# Patient Record
Sex: Female | Born: 1960 | Race: White | Hispanic: No | Marital: Married | Smoking: Never smoker
Health system: Southern US, Community
[De-identification: ages and names within clinical notes are randomized; demographics above are authoritative.]

## PROBLEM LIST (undated history)

## (undated) DIAGNOSIS — F329 Major depressive disorder, single episode, unspecified: Secondary | ICD-10-CM

## (undated) DIAGNOSIS — F419 Anxiety disorder, unspecified: Secondary | ICD-10-CM

## (undated) DIAGNOSIS — Z9289 Personal history of other medical treatment: Secondary | ICD-10-CM

## (undated) DIAGNOSIS — G5603 Carpal tunnel syndrome, bilateral upper limbs: Secondary | ICD-10-CM

## (undated) DIAGNOSIS — F32A Depression, unspecified: Secondary | ICD-10-CM

## (undated) DIAGNOSIS — E039 Hypothyroidism, unspecified: Secondary | ICD-10-CM

## (undated) DIAGNOSIS — B009 Herpesviral infection, unspecified: Secondary | ICD-10-CM

## (undated) DIAGNOSIS — S83209A Unspecified tear of unspecified meniscus, current injury, unspecified knee, initial encounter: Secondary | ICD-10-CM

## (undated) DIAGNOSIS — K611 Rectal abscess: Secondary | ICD-10-CM

## (undated) DIAGNOSIS — M069 Rheumatoid arthritis, unspecified: Secondary | ICD-10-CM

## (undated) HISTORY — DX: Depression, unspecified: F32.A

## (undated) HISTORY — PX: TONSILLECTOMY AND ADENOIDECTOMY: SUR1326

## (undated) HISTORY — DX: Carpal tunnel syndrome, bilateral upper limbs: G56.03

## (undated) HISTORY — DX: Rectal abscess: K61.1

## (undated) HISTORY — DX: Major depressive disorder, single episode, unspecified: F32.9

## (undated) HISTORY — DX: Personal history of other medical treatment: Z92.89

## (undated) HISTORY — DX: Herpesviral infection, unspecified: B00.9

## (undated) HISTORY — DX: Rheumatoid arthritis, unspecified: M06.9

## (undated) HISTORY — DX: Anxiety disorder, unspecified: F41.9

## (undated) HISTORY — DX: Hypothyroidism, unspecified: E03.9

## (undated) HISTORY — DX: Unspecified tear of unspecified meniscus, current injury, unspecified knee, initial encounter: S83.209A

---

## 1993-11-18 HISTORY — PX: TUBAL LIGATION: SHX77

## 2001-08-24 ENCOUNTER — Other Ambulatory Visit: Admission: RE | Admit: 2001-08-24 | Discharge: 2001-08-24 | Payer: Self-pay | Admitting: Obstetrics and Gynecology

## 2002-10-22 ENCOUNTER — Other Ambulatory Visit: Admission: RE | Admit: 2002-10-22 | Discharge: 2002-10-22 | Payer: Self-pay | Admitting: Obstetrics and Gynecology

## 2003-12-29 ENCOUNTER — Other Ambulatory Visit: Admission: RE | Admit: 2003-12-29 | Discharge: 2003-12-29 | Payer: Self-pay | Admitting: Obstetrics and Gynecology

## 2004-01-04 ENCOUNTER — Ambulatory Visit (HOSPITAL_COMMUNITY): Admission: RE | Admit: 2004-01-04 | Discharge: 2004-01-04 | Payer: Self-pay | Admitting: Obstetrics and Gynecology

## 2005-03-11 ENCOUNTER — Other Ambulatory Visit: Admission: RE | Admit: 2005-03-11 | Discharge: 2005-03-11 | Payer: Self-pay | Admitting: Obstetrics and Gynecology

## 2005-05-28 ENCOUNTER — Ambulatory Visit (HOSPITAL_COMMUNITY): Admission: RE | Admit: 2005-05-28 | Discharge: 2005-05-28 | Payer: Self-pay | Admitting: Obstetrics and Gynecology

## 2005-06-07 ENCOUNTER — Encounter: Admission: RE | Admit: 2005-06-07 | Discharge: 2005-06-07 | Payer: Self-pay | Admitting: Obstetrics and Gynecology

## 2006-04-24 ENCOUNTER — Other Ambulatory Visit: Admission: RE | Admit: 2006-04-24 | Discharge: 2006-04-24 | Payer: Self-pay | Admitting: Obstetrics & Gynecology

## 2006-07-19 HISTORY — PX: TOTAL ABDOMINAL HYSTERECTOMY: SHX209

## 2006-08-12 ENCOUNTER — Inpatient Hospital Stay (HOSPITAL_COMMUNITY): Admission: RE | Admit: 2006-08-12 | Discharge: 2006-08-14 | Payer: Self-pay | Admitting: Obstetrics & Gynecology

## 2006-08-12 ENCOUNTER — Encounter (INDEPENDENT_AMBULATORY_CARE_PROVIDER_SITE_OTHER): Payer: Self-pay | Admitting: *Deleted

## 2006-09-16 ENCOUNTER — Ambulatory Visit (HOSPITAL_COMMUNITY): Admission: RE | Admit: 2006-09-16 | Discharge: 2006-09-16 | Payer: Self-pay | Admitting: Obstetrics & Gynecology

## 2007-10-12 ENCOUNTER — Ambulatory Visit (HOSPITAL_COMMUNITY): Admission: RE | Admit: 2007-10-12 | Discharge: 2007-10-12 | Payer: Self-pay | Admitting: Obstetrics & Gynecology

## 2008-10-20 ENCOUNTER — Ambulatory Visit (HOSPITAL_COMMUNITY): Admission: RE | Admit: 2008-10-20 | Discharge: 2008-10-20 | Payer: Self-pay | Admitting: Obstetrics & Gynecology

## 2009-02-16 DIAGNOSIS — K611 Rectal abscess: Secondary | ICD-10-CM

## 2009-02-16 HISTORY — PX: INCISION AND DRAINAGE PERIRECTAL ABSCESS: SHX1804

## 2009-02-16 HISTORY — DX: Rectal abscess: K61.1

## 2009-10-26 ENCOUNTER — Ambulatory Visit (HOSPITAL_COMMUNITY): Admission: RE | Admit: 2009-10-26 | Discharge: 2009-10-26 | Payer: Self-pay | Admitting: Obstetrics & Gynecology

## 2009-12-15 HISTORY — PX: CARPAL TUNNEL RELEASE: SHX101

## 2010-11-13 ENCOUNTER — Ambulatory Visit (HOSPITAL_COMMUNITY)
Admission: RE | Admit: 2010-11-13 | Discharge: 2010-11-13 | Payer: Self-pay | Source: Home / Self Care | Attending: Obstetrics & Gynecology | Admitting: Obstetrics & Gynecology

## 2011-04-05 NOTE — Op Note (Signed)
NAMEJONNELL, Carla Lawrence                 ACCOUNT NO.:  [id number]   MEDICAL RECORD NO.:  [id number]          PATIENT TYPE:  AMB   LOCATION:  SDC                           FACILITY:  [facility]   PHYSICIAN:  M. Leda Quail, MD  DATE OF BIRTH:  1961-10-15   DATE OF PROCEDURE:  08/12/2006  DATE OF DISCHARGE:                                 OPERATIVE REPORT   PREOPERATIVE DIAGNOSES:  68. A 50 year old gravida 2, para 2 married white female with history of      menorrhagia.  2. Fibroid uterus.  3. Iron deficiency anemia.  4. History of cesarean section x2.  5. Hypothyroidism.   POSTOPERATIVE DIAGNOSES:  71. A 50 year old gravida 2, para 2 married white female with history of      menorrhagia.  2. Fibroid uterus.  3. Iron deficiency anemia.  4. History of cesarean section x2.  5. Hypothyroidism.   PROCEDURE:  Total abdominal hysterectomy.   SURGEON:  M. Leda Quail, M.D.   ASSISTANT:  Meredeth Ide, M.D.   ANESTHESIA:  General endotracheal.   SPECIMENS:  Uterus and cervix to pathology.   ESTIMATED BLOOD LOSS:  100 mL.   URINE OUTPUT:  250 mL of clear urine.   FLUIDS:  2200 mL of LR.   COMPLICATIONS:  None.   INDICATIONS:  Ms. Carla Lawrence is a very pleasant 50 year old G2, P2 married white  female with a history of C-section x2.  She presented here for annual exam  which was earlier in the year, complaining of much worse menstrual cycles.  She is having several days of very heavy bleeding where she is having to  change a super pad and tampon every 2-3 hours and still soaking through at  times. The patient is passing large clots. Her hemoglobin was in the upper  10 range and iron studies were consistent with iron deficiency anemia.  She  underwent an ultrasound which showed multiple fibroids in the uterus.  We  discussed treatment options and she opted for definitive treatment. She has  been on iron to improve her hemoglobin which was 11.8 before the surgery.   PROCEDURE:  The  patient was taken to the operating room. She was placed in  supine position with running IVs in place. Informed consent is present on  the chart.  General endotracheal anesthesia was administered by the  anesthesia staff.   The legs were frog-legged and the abdomen, perineum, inner thighs and vagina  area prepped and draped in a normal sterile fashion.  Prior to draping the  patient a Foley catheter is inserted in the bladder under sterile conditions  and the legs are straightened. Once the patient was draped in a normal  sterile fashion, a Pfannenstiel skin incision was made with the knife and  carried down through subcutaneous fat and tissues. Cautery was used for  hemostasis. The fascia was identified and niched in the midline. Fascial  incision was extended laterally using curved Mayo scissors. Kocher clamps  were applied to the fascial incision and underlying rectus muscles were  dissected off sharply. In similar fashion, Kocher clamps  were applied to the  aspect of the fascial incision and the rectus muscles were dissected off  sharply. Rectus muscles appeared to have previously been sewn together after  a C-section.  Therefore, high at the level of the reapproximation of the  rectus muscles, hemostats were used to spread the muscles. The peritoneum  was actually popped through here very easily. The operator's index finger  was placed in the incision and the rectus muscles were first separated  sharply with Metzenbaum scissors and then the underlying peritoneum was  incised with Metzenbaum scissors. This was done superiorly and inferiorly  and the anterior incision was extended until the edge of the bladder was  noted.  At this point, survey of the pelvis was performed. There did appear  to be some adhesions to the anterior aspect of the cervix from previous C-  sections. Otherwise there was no endometriosis present.  A Balfour retractor  with short blades were placed in the  incision.  Care was taken to ensure  there was no bowel behind the blade. Also, the blades were not sitting down  on the psoas muscle.  The bladder blade was inserted and attached. Then the  bowel was packed anteriorly with three moist laparotomy sponges and a  superior arm was placed on the Balfour retractor and a malleable retractor  was used to hold the bowel and skin edge out of the way.   At this point long Kelly clamps were placed across the utero-ovarian  pedicles. The ovaries were visualized. History of tubal ligation was  present.  Ovaries appeared normal. There was no scar tissue, no  endometriosis and no cyst present. The fallopian tubes appeared normal  except on the left side it was somewhat clubbed but again, there had been a  tubal ligation previously.  The round ligament on the right side was suture  ligated and incised, opening the anterior and posterior leaf of the broad  ligament. The anterior leaf of the broad ligament was then incised along the  peritoneal edge to the level of the internal os to begin the creation of the  bladder flap.  The posterior leaf was incised parallel to the IP ligament.  The utero-ovarian pedicle was isolated and ureter was identified before  isolating the pedicle.  Then a curved Heaney clamp was placed across the  pedicle. This pedicle was transected and suture ligated first with free tie  of 0 Vicryl and second was a stitch tie. In similar fashion the round  ligament on the left side was suture ligated and incised. The anterior leaf  of the broad ligament was opened along the peritoneal edge and connected to  a previous incision to again begin creation of bladder flap.  Posterior leaf  was incised along the peritoneum parallel to the IP ligament. Then the  uterine ovarian pedicle on the left side was isolated and clamped with a  curved Heaney clamp. The pedicle was transected and suture ligated with a free tie of 0 Vicryl and then a stitch  of 0 Vicryl.  At this point,  attention was turned anteriorly on the bladder flap. With a significant  amount of scar tissue that was present, using sharp dissection, this was  incised and the pubovesicocervical fascia was ultimately identified and then  with a sponge stick, the bladder was slid down the cervix and the white  glistening tissue of the cervix was easily identified. At this point the  uterine arteries were skeletonized bilaterally. Curved  Heaney clamps were  placed at the level of the internal os of the cervix and across the uterine  arteries bilaterally. These pedicles were transected and suture ligated with  two stitches of #0 Vicryl.  The cardial ligaments were serially clamped,  transected and suture ligated with #0 Vicryl. The bladder was well out of  the way.  Then at the base of the cervix curved Heaney clamps were placed  across the uterosacral ligament. Pedicles were transected and the vagina was  entered.  Heaney suture of #0 Vicryl was placed across these pedicles and  the sutures were left long. At this point, as the vagina was entered, a pair  of Ocie Doyne scissors was used to circumscribe around the cervix, trimming  as little vaginal mucosa as possible.  Once the cervix had been  circumscribed with the [facility] scissors, the specimen was handed off.  This was just the cervix and the uterus.  Then Kocher clamps were used to  grasp the vaginal mucosa and the peritoneum posteriorly and vaginal mucosa  anteriorly.  An angle stitch was placed and attached to the uterosacral  ligament to ensure excellent hemostasis.  This was done bilaterally.  Then  the cuff was closed with three interrupted figure-of-8 sutures of #0 Vicryl.  These sutures were left leg. At this point, the pelvis was irrigated. There  was a small amount of bleeding from the posterior peritoneum on the  posterior aspect of the vagina and a figure-of-8 suture of #2-0 Vicryl was  used to obtain  excellent hemostasis here.  The round ligaments were  inspected. No bleeding was noted. The bladder flap was inspected. No  bleeding was noted and the uterine ovarian pedicles were inspected. No  bleeding was noted. The ovaries were then attached to the round ligaments to  keep them out of the pelvis and from falling off the cuff.  At this point  there was excellent hemostasis and bilateral ureter peristalsis and  procedure was ended. The Balfour retractor was removed and the associated  attached blades. Three moistened laparotomy sponges were removed and the  bowel was placed back in normal anatomic position.  The peritoneum was then  closed with a running suture of #2-0 Vicryl.  Fascial tissue and rectus  muscles were visualized. No bleeding was noted.   An On-Q pump tubing was placed on the right side of the patient  subfascially.  The tubing was held in place along the skin with Steri- Strips.  Then the fascia was closed with running stitch. The stitches were  initially placed in the corner and run to the midline bilaterally.  The  subcutaneous tissue was irrigated.  There was a small amount of bleeding  over on the left side which was made hemostatic with cautery.  Then an On-Q  pump tubing on the left side was also placed subcutaneously.  The tubing was  attached to the skin using Steri-Strips.  Then the skin was closed with a  subcuticular stitch of 3-0 Vicryl.  The incision was cleansed and Benzoin  and Steri-Strips were applied.  Five mL of 1% lidocaine was instilled  subcutaneously and 10 mL of 1% lidocaine was instilled subfascially.  The  patient tolerated the procedure well. Sponge, lap, needle and instrument  counts were correct x2. She made urine during the procedure and she was  extubated from general endotracheal anesthesia without difficulty and taken  to the recovery room in stable condition.      Desiree Lucy  Hyacinth Meeker, MD  Electronically Signed     MSM/MEDQ  D:   08/12/2006  T:  08/14/2006  Job:  [id number]

## 2011-04-05 NOTE — Discharge Summary (Signed)
Carla Lawrence, Carla Lawrence                 ACCOUNT NO.:  [id number]   MEDICAL RECORD NO.:  [id number]          PATIENT TYPE:  INP   LOCATION:  9309                          FACILITY:  [facility]   PHYSICIAN:  M. Leda Quail, MD  DATE OF BIRTH:  [birth year]   DATE OF ADMISSION:  08/12/2006  DATE OF DISCHARGE:  08/14/2006                                 DISCHARGE SUMMARY   ADMISSION DIAGNOSES:  87. A 50 year old, G2, P8, married white female with history of      menorrhagia.  2. Fibroid uterus.  3. Iron-deficiency anemia.  4. History of cesarean section x2.  5. Hypothyroidism.   DISCHARGE DIAGNOSIS:  1. A 50 year old, G2, P77, married white female with history of      menorrhagia.  2. Fibroid uterus.  3. Iron-deficiency anemia.  4. History of cesarean section x2.  5. Hypothyroidism.   PROCEDURE:  Total abdominal hysterectomy.   HISTORY AND PHYSICAL:  Written H&P is in the chart.  Carla Lawrence was admitted  for same-day surgery and taken to the operating room where a TAH was  performed due to the above problems.  The surgery was uneventful.  She had  approximately 100 cc of blood loss.  She did well during the surgery and  made 250 cc of clear urine output.  The patient was taken to the recovery  room after the surgery was over and after the appropriate time there to the  third floor for recovery.  She had a Foley catheter overnight and Dilaudid  PCA, as well as Toradol for pain control.  On the evening of postoperative  day #0, she is doing well, having good pain control.  She had no nausea.  Her evening was uneventful, and the morning of postoperative day #2, vital  signs were stable and she was afebrile.  Her Foley catheter was removed, and  she was able to void without difficulty.  The patient was able to advance to  a regular diet, and the Toradol and Dilaudid PCA were removed.  Postoperative hemoglobin was 9.8 and postoperative white count was 10.6,  platelet count was 304.  By the  evening of postoperative day #0, she was  completely unhooked from all IV tubing.  She did well overnight.  On the  morning of postoperative day #2 had placed flatus, she was ambulating and  taking a shower, had excellent pain control with Percocet.  She was  otherwise feeling well, and discharge was felt appropriate.   PATHOLOGY:  Pathology showed adenomyosis and uterine leiomyomas.      Carla Keas, MD  Electronically Signed    MSM/MEDQ  D:  08/29/2006  T:  09/01/2006  Job:  [id number]

## 2011-10-24 ENCOUNTER — Other Ambulatory Visit: Payer: Self-pay | Admitting: Obstetrics & Gynecology

## 2011-10-24 DIAGNOSIS — Z1231 Encounter for screening mammogram for malignant neoplasm of breast: Secondary | ICD-10-CM

## 2011-12-03 ENCOUNTER — Ambulatory Visit (HOSPITAL_COMMUNITY): Payer: Self-pay

## 2011-12-05 ENCOUNTER — Ambulatory Visit (HOSPITAL_COMMUNITY)
Admission: RE | Admit: 2011-12-05 | Discharge: 2011-12-05 | Disposition: A | Discharge status: Home / Self Care | Payer: PRIVATE HEALTH INSURANCE | Source: Ambulatory Visit | Attending: Obstetrics & Gynecology | Admitting: Obstetrics & Gynecology

## 2011-12-05 DIAGNOSIS — Z1231 Encounter for screening mammogram for malignant neoplasm of breast: Secondary | ICD-10-CM | POA: Insufficient documentation

## 2012-11-13 ENCOUNTER — Other Ambulatory Visit: Payer: Self-pay | Admitting: Obstetrics & Gynecology

## 2012-11-13 DIAGNOSIS — Z1231 Encounter for screening mammogram for malignant neoplasm of breast: Secondary | ICD-10-CM

## 2012-12-08 ENCOUNTER — Ambulatory Visit (HOSPITAL_COMMUNITY): Payer: PRIVATE HEALTH INSURANCE

## 2012-12-22 ENCOUNTER — Ambulatory Visit (HOSPITAL_COMMUNITY)
Admission: RE | Admit: 2012-12-22 | Discharge: 2012-12-22 | Disposition: A | Discharge status: Home / Self Care | Payer: PRIVATE HEALTH INSURANCE | Source: Ambulatory Visit | Attending: Obstetrics & Gynecology | Admitting: Obstetrics & Gynecology

## 2012-12-22 DIAGNOSIS — Z1231 Encounter for screening mammogram for malignant neoplasm of breast: Secondary | ICD-10-CM | POA: Insufficient documentation

## 2013-06-08 ENCOUNTER — Other Ambulatory Visit: Payer: Self-pay | Admitting: Obstetrics & Gynecology

## 2013-06-08 NOTE — Telephone Encounter (Signed)
Pharmacy Requesting   Zoloft 50 mg Synthroid 88 mcg  Last Aex 05/14/13 rfs x 1 year given for both AEX scheduled 06/16/13  Synthroid 88 mcg #30/0rfs ; Zoloft 50 mg #30/0rf's sent through to [facility] pharmacy to last pt. Until AEX.

## 2013-06-08 NOTE — Telephone Encounter (Signed)
Correction AEX was 05/14/12

## 2013-06-15 ENCOUNTER — Encounter: Payer: Self-pay | Admitting: Obstetrics & Gynecology

## 2013-06-16 ENCOUNTER — Ambulatory Visit: Payer: Self-pay | Admitting: Obstetrics & Gynecology

## 2013-06-16 ENCOUNTER — Encounter: Payer: Self-pay | Admitting: Obstetrics & Gynecology

## 2013-06-16 ENCOUNTER — Ambulatory Visit (INDEPENDENT_AMBULATORY_CARE_PROVIDER_SITE_OTHER): Payer: PRIVATE HEALTH INSURANCE | Admitting: Obstetrics & Gynecology

## 2013-06-16 VITALS — BP 148/84 | HR 64 | Resp 12 | Ht 61.0 in | Wt 235.4 lb

## 2013-06-16 DIAGNOSIS — N952 Postmenopausal atrophic vaginitis: Secondary | ICD-10-CM

## 2013-06-16 DIAGNOSIS — Z01419 Encounter for gynecological examination (general) (routine) without abnormal findings: Secondary | ICD-10-CM

## 2013-06-16 DIAGNOSIS — Z0000 Encounter for general adult medical examination without abnormal findings: Secondary | ICD-10-CM

## 2013-06-16 DIAGNOSIS — E039 Hypothyroidism, unspecified: Secondary | ICD-10-CM

## 2013-06-16 LAB — POCT URINALYSIS DIPSTICK
Ketones, UA: NEGATIVE
Protein, UA: NEGATIVE
pH, UA: 5

## 2013-06-16 LAB — LIPID PANEL
HDL: 55 mg/dL (ref >39)
LDL Cholesterol: 189 mg/dL — ABNORMAL HIGH (ref 0–99)
Total CHOL/HDL Ratio: 5.1 Ratio

## 2013-06-16 LAB — TSH: TSH: 10.054 u[IU]/mL — ABNORMAL HIGH (ref 0.350–4.500)

## 2013-06-16 MED ORDER — SERTRALINE HCL 50 MG PO TABS: ORAL_TABLET | ORAL | Status: DC

## 2013-06-16 NOTE — Progress Notes (Addendum)
52 y.o. G2P2 MarriedCaucasianF here for annual exam.  Heading to conference in [place], [place].  Repots having hot flashes especially at night.  Coping ok with this.    Patient's last menstrual period was 11/18/2005.          Sexually active: yes  The current method of family planning is status post hysterectomy.    Exercising: yes  occassional walking with dog Smoker:  no  Health Maintenance: Pap:  04/24/06 WNL, H/O TAH History of abnormal Pap:  no MMG:  12/22/12 normal  Colonoscopy:  2012 repeat in 10 years, Dr. Loreta Ave BMD:   none TDaP:  7/10 Screening Labs: today, Hb today: 13.4, Urine today: WBC-trace   reports that she has never smoked. She has never used smokeless tobacco. She reports that she does not drink alcohol or use illicit drugs.  Past Medical History  Diagnosis Date  . HSV infection     HSV I/II  . Hypothyroid   . Anxiety and depression   . Perirectal abscess     h/o  . History of blood transfusion     during T&A    Past Surgical History  Procedure Laterality Date  . Tubal ligation  1995  . Cesarean section    . Total abdominal hysterectomy  9/07  . Incision and drainage perirectal abscess  4/10  . Carpal tunnel release  12/15/09    and tendon clean out  . Incision and drainage perirectal abscess  5/12    and EUA  . Tonsillectomy and adenoidectomy      Current Outpatient Prescriptions  Medication Sig Dispense Refill  . amoxicillin (AMOXIL) 875 MG tablet 875 mg 2 (two) times daily.      Marland Kitchen FOLIC ACID PO Take by mouth daily.      Marland Kitchen levothyroxine (SYNTHROID, LEVOTHROID) 88 MCG tablet TAKE 1 TABLET BY MOUTH EVERY DAY  30 tablet  0  . methotrexate (50 MG/ML) 1 G injection Inject 70 mg into the vein once a week.      . Multiple Vitamins-Minerals (MULTIVITAMIN PO) Take by mouth daily.      . sertraline (ZOLOFT) 50 MG tablet TAKE 1 TABLET BY MOUTH EVERY DAY  30 tablet  0  . Vitamin D, Ergocalciferol, (DRISDOL) 50000 UNITS CAPS Take 50,000 Units by mouth. Every other  week       No current facility-administered medications for this visit.    Family History  Problem Relation Age of Onset  . Diabetes Maternal Grandfather   . Cancer Father     colon and bone  . Rheumatic fever Mother   . Bell's palsy Mother   . Heart disease Mother   . Heart attack Brother     over 36 years old    ROS:  Pertinent items are noted in HPI.  Otherwise, a comprehensive ROS was negative.  Exam:   BP 148/84  Pulse 64  Resp 12  Ht 5\' 1"  (1.549 m)  Wt 235 lb 6.4 oz (106.777 kg)  BMI 44.5 kg/m2  LMP 11/18/2005  Weight change: +4lb  Height: 5\' 1"  (154.9 cm)  Ht Readings from Last 3 Encounters:  06/16/13 5\' 1"  (1.549 m)    General appearance: alert, cooperative and appears stated age Head: Normocephalic, without obvious abnormality, atraumatic Neck: no adenopathy, supple, symmetrical, trachea midline and thyroid normal to inspection and palpation Lungs: clear to auscultation bilaterally Breasts: normal appearance, no masses or tenderness Heart: regular rate and rhythm Abdomen: soft, non-tender; bowel sounds normal; no masses,  no organomegaly Extremities: extremities normal, atraumatic, no cyanosis or edema Skin: Skin color, texture, turgor normal. No rashes or lesions Lymph nodes: Cervical, supraclavicular, and axillary nodes normal. No abnormal inguinal nodes palpated Neurologic: Grossly normal   Pelvic: External genitalia:  no lesions              Urethra:  normal appearing urethra with no masses, tenderness or lesions              Bartholins and Skenes: normal                 Vagina: normal appearing vagina with normal color and discharge, no lesions              Cervix: absent              Pap taken: no Bimanual Exam:  Uterus:  uterus absent              Adnexa: no mass, fullness, tenderness               Rectovaginal: Confirms               Anus:  normal sphincter tone, no lesions  A:  Well Woman with normal exam H/O TAH secondary to  fibroids/adenomyosis H/O Depression H/O hypothyroidism R.A.--sees Dr. Nickola Major H/O perirectal abscess 4/10  P:   Mammogram yearly.  Pt knows is due.  She will schedule.   Pap smear not needed.  H/O TAH. TSH, Lipids, and Vit D today. FSH. Synthroid qd.  RX to pharmacy.  Will want 90 day supply. Zoloft 50mg .  RX to pharmacy.   Vit D rx will be done after labs back. return annually or prn  An After Visit Summary was printed and given to the patient.

## 2013-06-16 NOTE — Patient Instructions (Addendum)

## 2013-06-17 MED ORDER — LEVOTHYROXINE SODIUM 100 MCG PO TABS: ORAL_TABLET | ORAL | Status: DC

## 2013-06-17 MED ORDER — VITAMIN D (ERGOCALCIFEROL) 1.25 MG (50000 UNIT) PO CAPS: 50000.0000 [IU] | ORAL_CAPSULE | ORAL | Status: DC

## 2013-06-17 NOTE — Addendum Note (Signed)
Addended by: Jerene Bears on: 06/17/2013 05:55 AM   Modules accepted: Orders

## 2013-06-18 ENCOUNTER — Ambulatory Visit: Payer: Self-pay | Admitting: Obstetrics & Gynecology

## 2013-06-22 ENCOUNTER — Telehealth: Payer: Self-pay

## 2013-06-22 NOTE — Telephone Encounter (Signed)
8/4 [email or web address]//kn

## 2013-06-22 NOTE — Telephone Encounter (Signed)
Message copied by Elisha Headland on Tue Jun 22, 2013 11:05 AM ------      Message from: Jerene Bears      Created: Thu Jun 17, 2013  5:52 AM       1) cholesterol is high.  Needs to see PCP this year to talk about treatment.  2)Definitely menopausal.  She is having hot flashes but doesn't want to be on anything right now  3) Thyroid function is low.  Increase Synthroid to daily.  RX to pharmacy done.  Recheck TSH here 3 months.  Order placed.  4)Vit 26.  Is she taking her Vitamin D regularly.  If easier, can take 1000 IU OTC Vit D and daily daily.  Recheck next year. ------

## 2013-07-04 ENCOUNTER — Other Ambulatory Visit: Payer: Self-pay | Admitting: Obstetrics & Gynecology

## 2013-07-11 ENCOUNTER — Other Ambulatory Visit: Payer: Self-pay | Admitting: Obstetrics & Gynecology

## 2013-07-21 NOTE — Telephone Encounter (Signed)
Patient returning [facility] call. Please call back at work number above.

## 2013-07-22 ENCOUNTER — Telehealth: Payer: Self-pay | Admitting: Obstetrics & Gynecology

## 2013-07-22 NOTE — Telephone Encounter (Signed)
Spoke with pt about lab results. Pt advised to see PCP about cholesterol management. Advised Vit D is 26. Pt reports taking supplement regularly. Advised pt she can take 1000 IU daily instead of Rx. Advised that her thyroid level is low and an increased dose of her synthroid was sent to pharmacy. Pt aware and has already been taking it. Scheduled lab appt to recheck TSH 09-22-13. Pt requests early appt at 8:30.

## 2013-07-22 NOTE — Telephone Encounter (Signed)
Amy notified patient of results.

## 2013-07-22 NOTE — Telephone Encounter (Signed)
Patient says she is returning a call to Kelly. °

## 2013-09-22 ENCOUNTER — Ambulatory Visit (INDEPENDENT_AMBULATORY_CARE_PROVIDER_SITE_OTHER): Payer: 59 | Admitting: Obstetrics & Gynecology

## 2013-09-22 DIAGNOSIS — E039 Hypothyroidism, unspecified: Secondary | ICD-10-CM

## 2013-09-27 ENCOUNTER — Telehealth: Payer: Self-pay

## 2013-09-27 MED ORDER — LEVOTHYROXINE SODIUM 100 MCG PO TABS: ORAL_TABLET | ORAL | Status: DC

## 2013-09-27 NOTE — Telephone Encounter (Signed)
Message copied by Elisha Headland on Mon Sep 27, 2013 11:52 AM ------      Message from: Jerene Bears      Created: Mon Sep 27, 2013  8:45 AM       Inform TSH normal.  Continue Synthroid and will recheck level next year.  Order placed for RFs for year. ------

## 2013-09-27 NOTE — Telephone Encounter (Signed)
lmtcb

## 2013-10-20 NOTE — Telephone Encounter (Signed)
Patient notified

## 2014-01-24 ENCOUNTER — Other Ambulatory Visit: Payer: Self-pay | Admitting: Obstetrics & Gynecology

## 2014-01-24 DIAGNOSIS — Z1231 Encounter for screening mammogram for malignant neoplasm of breast: Secondary | ICD-10-CM

## 2014-02-01 ENCOUNTER — Other Ambulatory Visit: Payer: Self-pay | Admitting: Obstetrics & Gynecology

## 2014-02-01 ENCOUNTER — Ambulatory Visit (HOSPITAL_COMMUNITY)
Admission: RE | Admit: 2014-02-01 | Discharge: 2014-02-01 | Disposition: A | Discharge status: Home / Self Care | Payer: PRIVATE HEALTH INSURANCE | Source: Ambulatory Visit | Attending: Obstetrics & Gynecology | Admitting: Obstetrics & Gynecology

## 2014-02-01 DIAGNOSIS — Z1231 Encounter for screening mammogram for malignant neoplasm of breast: Secondary | ICD-10-CM | POA: Insufficient documentation

## 2014-06-29 ENCOUNTER — Other Ambulatory Visit: Payer: Self-pay | Admitting: Obstetrics & Gynecology

## 2014-06-29 NOTE — Telephone Encounter (Signed)
Last refilled/AEX: 06/16/13 #90/4 refills  AEX Scheduled: 08/26/14 with Dr. Hyacinth Meeker  Please Advise.

## 2014-08-26 ENCOUNTER — Encounter: Payer: Self-pay | Admitting: Obstetrics & Gynecology

## 2014-08-26 ENCOUNTER — Ambulatory Visit (INDEPENDENT_AMBULATORY_CARE_PROVIDER_SITE_OTHER): Payer: 59 | Admitting: Obstetrics & Gynecology

## 2014-08-26 VITALS — BP 138/86 | HR 68 | Resp 18 | Ht 61.0 in | Wt 240.0 lb

## 2014-08-26 DIAGNOSIS — Z0000 Encounter for general adult medical examination without abnormal findings: Secondary | ICD-10-CM

## 2014-08-26 DIAGNOSIS — Z01419 Encounter for gynecological examination (general) (routine) without abnormal findings: Secondary | ICD-10-CM

## 2014-08-26 DIAGNOSIS — M069 Rheumatoid arthritis, unspecified: Secondary | ICD-10-CM

## 2014-08-26 LAB — COMPREHENSIVE METABOLIC PANEL
ALBUMIN: 4.1 g/dL (ref 3.5–5.2)
ALT: 13 U/L (ref 0–35)
AST: 15 U/L (ref 0–37)
Alkaline Phosphatase: 59 U/L (ref 39–117)
BUN: 14 mg/dL (ref 6–23)
CALCIUM: 9.1 mg/dL (ref 8.4–10.5)
CHLORIDE: 105 meq/L (ref 96–112)
CO2: 26 mEq/L (ref 19–32)
Creat: 0.79 mg/dL (ref 0.50–1.10)
GLUCOSE: 87 mg/dL (ref 70–99)
POTASSIUM: 4.4 meq/L (ref 3.5–5.3)
SODIUM: 139 meq/L (ref 135–145)
TOTAL PROTEIN: 6.7 g/dL (ref 6.0–8.3)
Total Bilirubin: 0.5 mg/dL (ref 0.2–1.2)

## 2014-08-26 LAB — TSH: TSH: 0.456 u[IU]/mL (ref 0.350–4.500)

## 2014-08-26 LAB — LIPID PANEL
CHOLESTEROL: 232 mg/dL — AB (ref 0–200)
HDL: 60 mg/dL (ref >39)
LDL Cholesterol: 146 mg/dL — ABNORMAL HIGH (ref 0–99)
Total CHOL/HDL Ratio: 3.9 Ratio
Triglycerides: 131 mg/dL (ref <150)
VLDL: 26 mg/dL (ref 0–40)

## 2014-08-26 LAB — POCT URINALYSIS DIPSTICK
BILIRUBIN UA: NEGATIVE
Glucose, UA: NEGATIVE
Ketones, UA: NEGATIVE
NITRITE UA: NEGATIVE
Protein, UA: NEGATIVE
UROBILINOGEN UA: NEGATIVE
pH, UA: 7

## 2014-08-26 LAB — HEMOGLOBIN, FINGERSTICK: HEMOGLOBIN, FINGERSTICK: 13.8 g/dL (ref 12.0–16.0)

## 2014-08-26 MED ORDER — SERTRALINE HCL 50 MG PO TABS: ORAL_TABLET | ORAL | Status: DC

## 2014-08-26 MED ORDER — LEVOTHYROXINE SODIUM 100 MCG PO TABS: ORAL_TABLET | ORAL | Status: DC

## 2014-08-26 NOTE — Progress Notes (Signed)
53 y.o. G2P2 MarriedCaucasianF here for annual exam.  Has a new granddaughter--Hunter Delorise Shiner.  They live in [place].  Pt is doing really well.  No vaginal bleeding.    Pt reports stopping her Methotrexate.  Having a lot of carpel tunnel issues on her right.  Has appt next week.  May have to have surgery.    Stopped her Vit D as well.  Does need labs today.    Complaint of hot flashes.  Not interested in HRT.  Pt wants to discuss OTC products.  Patient's last menstrual period was 11/18/2005.          Sexually active: Yes.    The current method of family planning is status post hysterectomy.    Exercising: No.  The patient does not participate in regular exercise at present. Smoker:  no  Health Maintenance: Pap: 04/2006 History of abnormal Pap:  no MMG: 01/2014 BIRADS1: Neg Colonoscopy: 07/2011 - repeat in 10 years, Dr. Loreta Ave BMD:   None TDaP: 05/2009 Screening Labs: today, Hb today: 13.8, Urine today: WBC:Mod, EEF:EOFHQ.   reports that she has never smoked. She has never used smokeless tobacco. She reports that she does not drink alcohol or use illicit drugs.  Past Medical History  Diagnosis Date  . HSV infection     HSV I/II  . Hypothyroid   . Anxiety and depression   . Perirectal abscess 4/10    h/o  . History of blood transfusion     during T&A  . Rheumatoid arthritis     Dr. Nickola Major  . Carpal tunnel syndrome, bilateral     Past Surgical History  Procedure Laterality Date  . Tubal ligation  1995  . Cesarean section  1989, 1995  . Total abdominal hysterectomy  9/07  . Incision and drainage perirectal abscess  4/10  . Carpal tunnel release  12/15/09    and tendon clean out  . Tonsillectomy and adenoidectomy      Current Outpatient Prescriptions  Medication Sig Dispense Refill  . levothyroxine (SYNTHROID, LEVOTHROID) 100 MCG tablet TAKE 1 TABLET BY MOUTH EVERY DAY  90 tablet  3  . Multiple Vitamins-Minerals (MULTIVITAMIN PO) Take by mouth daily.      . sertraline  (ZOLOFT) 50 MG tablet TAKE 1 TABLET BY MOUTH EVERY DAY  90 tablet  0  . CIPRODEX otic suspension        No current facility-administered medications for this visit.    Family History  Problem Relation Age of Onset  . Diabetes Maternal Grandfather   . Cancer Father     colon and bone  . Rheumatic fever Mother   . Bell's palsy Mother   . Heart disease Mother   . Heart attack Brother     over 65 years old    ROS:  Pertinent items are noted in HPI.  Otherwise, a comprehensive ROS was negative.  Exam:   BP 138/86  Pulse 68  Resp 18  Ht 5\' 1"  (1.549 m)  Wt 240 lb (108.863 kg)  BMI 45.37 kg/m2  LMP 11/18/2005  Weight change: +5#  Height: 5\' 1"  (154.9 cm)  Ht Readings from Last 3 Encounters:  08/26/14 5\' 1"  (1.549 m)  06/16/13 5\' 1"  (1.549 m)    General appearance: alert, cooperative and appears stated age Head: Normocephalic, without obvious abnormality, atraumatic Neck: no adenopathy, supple, symmetrical, trachea midline and thyroid normal to inspection and palpation Lungs: clear to auscultation bilaterally Breasts: normal appearance, no masses or tenderness Heart: regular  rate and rhythm Abdomen: soft, non-tender; bowel sounds normal; no masses,  no organomegaly Extremities: extremities normal, atraumatic, no cyanosis or edema Skin: Skin color, texture, turgor normal. No rashes or lesions Lymph nodes: Cervical, supraclavicular, and axillary nodes normal. No abnormal inguinal nodes palpated Neurologic: Grossly normal   Pelvic: External genitalia:  no lesions              Urethra:  normal appearing urethra with no masses, tenderness or lesions              Bartholins and Skenes: normal                 Vagina: normal appearing vagina with normal color and discharge, no lesions              Cervix: absent              Pap taken: No. Bimanual Exam:  Uterus:  uterus absent              Adnexa: no mass, fullness, tenderness               Rectovaginal: Confirms                Anus:  normal sphincter tone, no lesions  A:  Well Woman with normal exam  H/O TAH secondary to fibroids/adenomyosis  H/O Depression  H/O hypothyroidism  R.A.--sees Dr. Nickola Major  H/O perirectal abscess 4/10   P: Mammogram yearly.  Pap smear not needed. H/O TAH.  TSH, Lipids, CMP and Vit D today.  Synthroid qd.  RX to pharmacy. 90 day supply given  Zoloft 50mg . RX to pharmacy.  90 day supply given May need to restart Vit D.  Pt aware we will call with results OTC products for hot flashes discussed and information given. return annually or prn  An After Visit Summary was printed and given to the patient.

## 2014-08-26 NOTE — Patient Instructions (Signed)
Estroven/Estroven PM Black Cohosh--up to twice daily Melatonin--sleep

## 2014-08-27 LAB — VITAMIN D 25 HYDROXY (VIT D DEFICIENCY, FRACTURES): VIT D 25 HYDROXY: 21 ng/mL — AB (ref 30–89)

## 2014-08-29 ENCOUNTER — Telehealth: Payer: Self-pay

## 2014-08-29 NOTE — Telephone Encounter (Signed)
Lmtcb//kn 

## 2014-08-29 NOTE — Telephone Encounter (Signed)
Message copied by Elisha Headland on Mon Aug 29, 2014 10:03 AM ------      Message from: Jerene Bears      Created: Mon Aug 29, 2014  6:14 AM       Inform cholesterol looks much better than last year.  Total 232.  Was 282.  TGs normla.  LDLs are 146, were 189.  Should always do this fasting.              CMP, TSH were normal.            Vit D 21.  I really do think she should take some.  OTC Vit D 1000 IU daily would be fine.             ------

## 2014-08-30 NOTE — Telephone Encounter (Signed)
Pt informed of results and voiced understanding

## 2014-09-19 ENCOUNTER — Encounter: Payer: Self-pay | Admitting: Obstetrics & Gynecology

## 2014-09-27 ENCOUNTER — Other Ambulatory Visit: Payer: Self-pay | Admitting: Obstetrics & Gynecology

## 2014-09-27 NOTE — Telephone Encounter (Signed)
Last AEX and refill  08/26/14 #90/4R

## 2014-10-03 ENCOUNTER — Encounter: Payer: Self-pay | Admitting: Obstetrics & Gynecology

## 2015-03-02 ENCOUNTER — Emergency Department (HOSPITAL_COMMUNITY)
Admission: EM | Admit: 2015-03-02 | Discharge: 2015-03-02 | Disposition: A | Discharge status: Home / Self Care | Payer: Commercial Indemnity | Attending: Emergency Medicine | Admitting: Emergency Medicine

## 2015-03-02 ENCOUNTER — Encounter (HOSPITAL_COMMUNITY): Payer: Self-pay | Admitting: Emergency Medicine

## 2015-03-02 DIAGNOSIS — Z8719 Personal history of other diseases of the digestive system: Secondary | ICD-10-CM | POA: Insufficient documentation

## 2015-03-02 DIAGNOSIS — Z8619 Personal history of other infectious and parasitic diseases: Secondary | ICD-10-CM | POA: Diagnosis not present

## 2015-03-02 DIAGNOSIS — Z791 Long term (current) use of non-steroidal anti-inflammatories (NSAID): Secondary | ICD-10-CM | POA: Insufficient documentation

## 2015-03-02 DIAGNOSIS — E039 Hypothyroidism, unspecified: Secondary | ICD-10-CM | POA: Diagnosis not present

## 2015-03-02 DIAGNOSIS — Z79899 Other long term (current) drug therapy: Secondary | ICD-10-CM | POA: Insufficient documentation

## 2015-03-02 DIAGNOSIS — R040 Epistaxis: Secondary | ICD-10-CM | POA: Diagnosis not present

## 2015-03-02 DIAGNOSIS — F418 Other specified anxiety disorders: Secondary | ICD-10-CM | POA: Diagnosis not present

## 2015-03-02 DIAGNOSIS — Z8669 Personal history of other diseases of the nervous system and sense organs: Secondary | ICD-10-CM | POA: Diagnosis not present

## 2015-03-02 DIAGNOSIS — M069 Rheumatoid arthritis, unspecified: Secondary | ICD-10-CM | POA: Diagnosis not present

## 2015-03-02 NOTE — ED Provider Notes (Addendum)
CSN: [id number]     Arrival date & time 03/02/15  5397 History   First MD Initiated Contact with Patient 03/02/15 [phone]     Chief Complaint  Patient presents with  . Epistaxis     (Consider location/radiation/quality/duration/timing/severity/associated sxs/prior Treatment) Patient is a 54 y.o. female presenting with nosebleeds. The history is provided by the patient.  Epistaxis Associated symptoms: no congestion, no dizziness and no headaches    patient was at work at approximately 9 in the morning had onset of a nosebleed from the left nares. Patient had trouble with that occurring on Saturday but it was stopped spontaneously with the pinching her nose. Bleeding now has also stopped the. 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 Blood pressure was high at work 186/108. Blood pressure upon arrival here was 160/85. Patient recently Symbicort nose spray. For allergies. Patient also had a nosebleed about a month ago.  Patient denies headache shortness of breath chest pain or any stroke symptoms.  Past Medical History  Diagnosis Date  . HSV infection     HSV I/II  . Hypothyroid   . Anxiety and depression   . Perirectal abscess 4/10    h/o  . History of blood transfusion     during T&A  . Rheumatoid arthritis     Dr. Nickola Major  . Carpal tunnel syndrome, bilateral    Past Surgical History  Procedure Laterality Date  . Tubal ligation  1995  . Cesarean section  1989, 1995  . Total abdominal hysterectomy  9/07  . Incision and drainage perirectal abscess  4/10  . Carpal tunnel release  12/15/09    and tendon clean out  . Tonsillectomy and adenoidectomy     Family History  Problem Relation Age of Onset  . Diabetes Maternal Grandfather   . Cancer Father     colon and bone  . Rheumatic fever Mother   . Bell's palsy Mother   . Heart disease Mother   . Heart attack Brother     over 40 years old   History  Substance Use Topics  . Smoking status: Never Smoker   . Smokeless tobacco: Never  Used  . Alcohol Use: No   OB History    Gravida Para Term Preterm AB TAB SAB Ectopic Multiple Living   2 2        2      Review of Systems  Constitutional: Negative for fatigue.  HENT: Positive for nosebleeds. Negative for congestion.   Eyes: Negative for visual disturbance.  Respiratory: Negative for shortness of breath.   Cardiovascular: Negative for chest pain.  Gastrointestinal: Negative for nausea, vomiting, abdominal pain, diarrhea and blood in stool.  Genitourinary: Negative for dysuria.  Musculoskeletal: Negative for back pain.  Skin: Negative for rash.  Neurological: Negative for dizziness, syncope, light-headedness and headaches.  Hematological: Does not bruise/bleed easily.  Psychiatric/Behavioral: Negative for confusion.      Allergies  Sulfa antibiotics  Home Medications   Prior to Admission medications   Medication Sig Start Date End Date Taking? Authorizing Provider  levothyroxine (SYNTHROID, LEVOTHROID) 100 MCG tablet TAKE 1 TABLET BY MOUTH EVERY DAY 08/26/14  Yes 10/26/14, MD  montelukast (SINGULAIR) 10 MG tablet Take 10 mg by mouth daily.   Yes Historical Provider, MD  naproxen sodium (ANAPROX) 220 MG tablet Take 220 mg by mouth every morning.   Yes Historical Provider, MD  sertraline (ZOLOFT) 50 MG tablet TAKE 1 TABLET BY MOUTH EVERY DAY 08/26/14  Yes 10/26/14  Hyacinth Meeker, MD   BP 133/66 mmHg  Pulse 85  Temp(Src) 97.6 F (36.4 C) (Oral)  Resp 16  SpO2 98%  LMP 11/18/2005 Physical Exam  Constitutional: She is oriented to person, place, and time. She appears well-developed and well-nourished. No distress.  HENT:  Head: Normocephalic and atraumatic.  Mouth/Throat: Oropharynx is clear and moist.  No bleeding down the back of throat. Left and right nares with no active bleeding or bleeding source.  Eyes: Conjunctivae and EOM are normal. Pupils are equal, round, and reactive to light.  Neck: Normal range of motion.  Cardiovascular: Normal rate, regular  rhythm and normal heart sounds.   Pulmonary/Chest: Effort normal and breath sounds normal. No respiratory distress.  Abdominal: Soft. Bowel sounds are normal. There is no tenderness.  Musculoskeletal: Normal range of motion.  Neurological: She is alert and oriented to person, place, and time. No cranial nerve deficit. She exhibits normal muscle tone.  Skin: Skin is warm. No rash noted.  Nursing note and vitals reviewed.   ED Course  Procedures (including critical care time) Labs Review Labs Reviewed - No data to display  Imaging Review No results found.   EKG Interpretation None      MDM   Final diagnoses:  Epistaxis    Patient started on Symbicort nose spray for allergies 3 months ago. Patient with some difficulty of left-sided nosebleed that has been able to been stopped with pressure pinch method. No active bleeding currently. Patient does have ear nose and throat doctor to follow-up with. Patient given on nosebleed technique instructions. Patient will return if unable to control the bleeding. No reason for a packing at the current time. Patient's blood pressure has been now borderline and was elevated at work blood pressure now is down to 133/66. Patient will keep a blood pressure log and follow-up with her doctor. Patient without any bleeding problems anywhere else. Patient not on any blood thinners. Patient sometimes does take Anaprox. Patient informed that this could increase bleeding. Patient also without any stroke symptoms no significant chest pain no headache no shortness of breath.   Vanetta Mulders, MD 03/02/15 1024  Vanetta Mulders, MD 03/02/15 1024  Addendum: Correction misunderstanding with the patient patient is not on Symbicort does not use any Nose spray. So steroids no spray would not initially be a culprit in the current bleeding problem. Follow up with ear nose and throat still needed.  Vanetta Mulders, MD 03/02/15 1034

## 2015-03-02 NOTE — Discharge Instructions (Signed)
Nosebleed A nosebleed can be caused by many things, including:  Getting hit hard in the nose.  Infections.  Dry nose.  Colds.  Medicines. Your doctor may do lab testing if you get nosebleeds a lot and the cause is not known. HOME CARE   If your nose was packed with material, keep it there until your doctor takes it out. Put the pack back in your nose if the pack falls out.  Do not blow your nose for 12 hours after the nosebleed.  Sit up and bend forward if your nose starts bleeding again. Pinch the front half of your nose nonstop for 20 minutes.  Put petroleum jelly inside your nose every morning if you have a dry nose.  Use a humidifier to make the air less dry.  Do not take aspirin.  Try not to strain, lift, or bend at the waist for many days after the nosebleed. GET HELP RIGHT AWAY IF:   Nosebleeds keep happening and are hard to stop or control.  You have bleeding or bruises that are not normal on other parts of the body.  You have a fever.  The nosebleeds get worse.  You get lightheaded, feel faint, sweaty, or throw up (vomit) blood. MAKE SURE YOU:   Understand these instructions.  Will watch your condition.  Will get help right away if you are not doing well or get worse. Document Released: 08/13/2008 Document Revised: 01/27/2012 Document Reviewed: 08/13/2008 [facility] Patient Information 2015 [place], [place]. This information is not intended to replace advice given to you by your health care provider. Make sure you discuss any questions you have with your health care provider.  Would recommend following over your nose and throat doctor for the left-sided nosebleed. It may be related to the singular. Would recommend stopping singular in the meantime. Also recommend a daily log of your blood pressure to take to your primary care doctor. Blood pressure seems to be borderline in treatment for hypertension may be required.  Follow the nosebleed instructions  provided. Return if unable to stop the bleeding with the pinch method.

## 2015-03-02 NOTE — ED Notes (Signed)
Pt c/o of nose bleed that started around 9am today. Pt check blood pressure while at work BP 186/108. Pt current BP is 160/85. Bleeding is currently controled with direct pressure. Pt sts she had nose bleed Saturday for about 20 minutes w/ pressure. Pt denies taking BP medications at this time.

## 2015-03-29 ENCOUNTER — Other Ambulatory Visit: Payer: Self-pay | Admitting: Obstetrics & Gynecology

## 2015-03-29 DIAGNOSIS — Z1231 Encounter for screening mammogram for malignant neoplasm of breast: Secondary | ICD-10-CM

## 2015-04-06 ENCOUNTER — Ambulatory Visit (HOSPITAL_COMMUNITY)
Admission: RE | Admit: 2015-04-06 | Discharge: 2015-04-06 | Disposition: A | Discharge status: Home / Self Care | Payer: PRIVATE HEALTH INSURANCE | Source: Ambulatory Visit | Attending: Obstetrics & Gynecology | Admitting: Obstetrics & Gynecology

## 2015-04-06 ENCOUNTER — Other Ambulatory Visit: Payer: Self-pay | Admitting: Obstetrics & Gynecology

## 2015-04-06 DIAGNOSIS — Z1231 Encounter for screening mammogram for malignant neoplasm of breast: Secondary | ICD-10-CM | POA: Insufficient documentation

## 2015-08-27 ENCOUNTER — Other Ambulatory Visit: Payer: Self-pay | Admitting: Obstetrics & Gynecology

## 2015-08-28 NOTE — Telephone Encounter (Signed)
Medication refill request: Levothyroxine and Zoloft Last AEX:  08-26-14 Next AEX: 10-19-15 Last MMG (if hormonal medication request): 04-07-15 WNL Refill authorized: please advise

## 2015-10-06 ENCOUNTER — Ambulatory Visit: Payer: 59 | Admitting: Obstetrics & Gynecology

## 2015-10-19 ENCOUNTER — Encounter: Payer: Self-pay | Admitting: Obstetrics & Gynecology

## 2015-10-19 ENCOUNTER — Ambulatory Visit (INDEPENDENT_AMBULATORY_CARE_PROVIDER_SITE_OTHER): Payer: Managed Care, Other (non HMO) | Admitting: Obstetrics & Gynecology

## 2015-10-19 VITALS — BP 122/80 | HR 72 | Resp 16 | Ht 61.0 in | Wt 240.0 lb

## 2015-10-19 DIAGNOSIS — Z01419 Encounter for gynecological examination (general) (routine) without abnormal findings: Secondary | ICD-10-CM | POA: Diagnosis not present

## 2015-10-19 DIAGNOSIS — Z0000 Encounter for general adult medical examination without abnormal findings: Secondary | ICD-10-CM

## 2015-10-19 DIAGNOSIS — Z205 Contact with and (suspected) exposure to viral hepatitis: Secondary | ICD-10-CM | POA: Diagnosis not present

## 2015-10-19 LAB — POCT URINALYSIS DIPSTICK
Bilirubin, UA: NEGATIVE
Blood, UA: NEGATIVE
Glucose, UA: NEGATIVE
Ketones, UA: NEGATIVE
LEUKOCYTES UA: NEGATIVE
Nitrite, UA: NEGATIVE
PROTEIN UA: NEGATIVE
UROBILINOGEN UA: NEGATIVE
pH, UA: 5

## 2015-10-19 MED ORDER — LEVOTHYROXINE SODIUM 100 MCG PO TABS: 100.0000 ug | ORAL_TABLET | Freq: Every day | ORAL | Status: DC

## 2015-10-19 MED ORDER — SERTRALINE HCL 50 MG PO TABS
50.0000 mg | ORAL_TABLET | Freq: Every day | ORAL | Status: DC
Start: 2015-10-19 — End: 2016-11-02

## 2015-10-19 NOTE — Progress Notes (Signed)
54 y.o. G2P2 MarriedCaucasianF here for annual exam.  Doing well.  No vaginal bleeding.  Had a granddaughter in [place].  Going to the beach after [place] with her extended family.  D/w pt having Hep C testing.  She had several transfusions when tonsils were removed as a child.    PCP:  Dr. Tresa Endo in [facility].    Patient's last menstrual period was 11/18/2005.          Sexually active: Yes.    The current method of family planning is status post hysterectomy.    Exercising: No.  exercise Smoker:  no  Health Maintenance: Pap:  6/07 neg History of abnormal Pap:  no MMG: 04-06-15 category b density,birads 1:neg Colonoscopy:  2012 f/u 31yrs, Dr Loreta Ave BMD:   none TDaP:  2010 Screening Labs: today, Hb today: 13.4 Urine today: neg Self breast exam: done occ   reports that she has never smoked. She has never used smokeless tobacco. She reports that she does not drink alcohol or use illicit drugs.  Past Medical History  Diagnosis Date  . HSV infection     HSV I/II  . Hypothyroid   . Anxiety and depression   . Perirectal abscess 4/10    h/o  . History of blood transfusion     during T&A  . Rheumatoid arthritis (HCC)     Dr. Nickola Major  . Carpal tunnel syndrome, bilateral     Past Surgical History  Procedure Laterality Date  . Tubal ligation  1995  . Cesarean section  1989, 1995  . Total abdominal hysterectomy  9/07  . Incision and drainage perirectal abscess  4/10  . Carpal tunnel release  12/15/09    and tendon clean out  . Tonsillectomy and adenoidectomy      Current Outpatient Prescriptions  Medication Sig Dispense Refill  . levothyroxine (SYNTHROID, LEVOTHROID) 100 MCG tablet TAKE 1 TABLET BY MOUTH EVERY DAY 90 tablet 0  . montelukast (SINGULAIR) 10 MG tablet Take 10 mg by mouth daily.    . naproxen sodium (ANAPROX) 220 MG tablet Take 220 mg by mouth every morning.    . sertraline (ZOLOFT) 50 MG tablet TAKE 1 TABLET BY MOUTH EVERY DAY 90 tablet 0   No current  facility-administered medications for this visit.    Family History  Problem Relation Age of Onset  . Diabetes Maternal Grandfather   . Cancer Father     colon and bone  . Rheumatic fever Mother   . Bell's palsy Mother   . Heart disease Mother   . Heart attack Brother     over 27 years old    ROS:  Pertinent items are noted in HPI.  Otherwise, a comprehensive ROS was negative.  Exam:   BP 122/80 mmHg  Pulse 72  Resp 16  Ht 5\' 1"  (1.549 m)  Wt 240 lb (108.863 kg)  BMI 45.37 kg/m2  LMP 11/18/2005   Height: 5\' 1"  (154.9 cm)  Ht Readings from Last 3 Encounters:  10/19/15 5\' 1"  (1.549 m)  08/26/14 5\' 1"  (1.549 m)  06/16/13 5\' 1"  (1.549 m)    General appearance: alert, cooperative and appears stated age Head: Normocephalic, without obvious abnormality, atraumatic Neck: no adenopathy, supple, symmetrical, trachea midline and thyroid normal to inspection and palpation Lungs: clear to auscultation bilaterally Breasts: normal appearance, no masses or tenderness Heart: regular rate and rhythm Abdomen: soft, non-tender; bowel sounds normal; no masses,  no organomegaly Extremities: extremities normal, atraumatic, no cyanosis or edema  Skin: Skin color, texture, turgor normal. No rashes or lesions Lymph nodes: Cervical, supraclavicular, and axillary nodes normal. No abnormal inguinal nodes palpated Neurologic: Grossly normal   Pelvic: External genitalia:  no lesions              Urethra:  normal appearing urethra with no masses, tenderness or lesions              Bartholins and Skenes: normal                 Vagina: normal appearing vagina with normal color and discharge, no lesions              Cervix: absent              Pap taken: No. Bimanual Exam:  Uterus:  uterus absent              Adnexa: no mass, fullness, tenderness               Rectovaginal: Confirms               Anus:  normal sphincter tone, no lesions  Chaperone was present for exam.  A:  Well Woman with  normal exam  H/O TAH secondary to fibroids/adenomyosis  H/O Depression  H/O hypothyroidism  R.A..  Isn't having regular follow up for this.  Did see Dr. Nickola Major in the past H/O perirectal abscess 4/10   P: Mammogram yearly Pap smear not needed. H/O TAH.  TSH, Lipids, CMP and Vit D today.  Hep C antibody testing done today Synthroid qd. RX to pharmacy. 90 day supply given  Zoloft 50mg . RX to pharmacy. 90 day supply given return annually or prn

## 2015-10-20 LAB — TSH: TSH: 0.733 u[IU]/mL (ref 0.350–4.500)

## 2015-10-20 LAB — COMPREHENSIVE METABOLIC PANEL
ALBUMIN: 4 g/dL (ref 3.6–5.1)
ALT: 16 U/L (ref 6–29)
AST: 15 U/L (ref 10–35)
Alkaline Phosphatase: 65 U/L (ref 33–130)
BILIRUBIN TOTAL: 0.5 mg/dL (ref 0.2–1.2)
BUN: 12 mg/dL (ref 7–25)
CO2: 28 mmol/L (ref 20–31)
Calcium: 9.2 mg/dL (ref 8.6–10.4)
Chloride: 103 mmol/L (ref 98–110)
Creat: 0.6 mg/dL (ref 0.50–1.05)
GLUCOSE: 90 mg/dL (ref 65–99)
Potassium: 4.6 mmol/L (ref 3.5–5.3)
Sodium: 139 mmol/L (ref 135–146)
Total Protein: 6.9 g/dL (ref 6.1–8.1)

## 2015-10-20 LAB — LIPID PANEL
Cholesterol: 242 mg/dL — ABNORMAL HIGH (ref 125–200)
HDL: 57 mg/dL (ref >=46)
LDL CALC: 162 mg/dL — AB (ref <130)
Total CHOL/HDL Ratio: 4.2 Ratio (ref <=5.0)
Triglycerides: 115 mg/dL (ref <150)
VLDL: 23 mg/dL (ref <30)

## 2015-10-20 LAB — HEPATITIS C ANTIBODY: HCV AB: NEGATIVE

## 2015-10-20 LAB — VITAMIN D 25 HYDROXY (VIT D DEFICIENCY, FRACTURES): Vit D, 25-Hydroxy: 15 ng/mL — ABNORMAL LOW (ref 30–100)

## 2015-10-23 LAB — HEMOGLOBIN, FINGERSTICK: HEMOGLOBIN, FINGERSTICK: 13.4 g/dL (ref 12.0–16.0)

## 2015-10-26 ENCOUNTER — Telehealth: Payer: Self-pay | Admitting: Emergency Medicine

## 2015-10-26 NOTE — Telephone Encounter (Signed)
-----   Message from Jerene Bears, MD sent at 10/23/2015  6:07 PM EST ----- Please inform pt that her hep c test was negative.  Vit D was still low.  I would recommend she take some Vit D but she is not interested in being on much medication so I think she will decline a prescription for this.  Also let he rknow HEr TSH and CMP were normal.  Lastly, her Lipids were elevated at 242 and LDL at 162.  This is in the range where treatment is initiated.  I am happy to send copies to her PCP.  I think she will decline any treatment for cholesterol issues as well.

## 2015-10-26 NOTE — Telephone Encounter (Signed)
Message left to return call to Carla Lawrence at [phone].    

## 2015-10-27 NOTE — Telephone Encounter (Signed)
Spoke with patient and message from Dr. Hyacinth Meeker given. Patient agrees with message from Dr. Hyacinth Meeker, she does not want to start medication but does authorize our office to send results to her PCP.  Name, DOB and address confirmed for verbal release.  Will send records to Dr. Tresa Endo.  Patient will call back with any concerns.

## 2016-08-08 ENCOUNTER — Other Ambulatory Visit: Payer: Self-pay | Admitting: Obstetrics & Gynecology

## 2016-08-08 DIAGNOSIS — Z1231 Encounter for screening mammogram for malignant neoplasm of breast: Secondary | ICD-10-CM

## 2016-08-27 ENCOUNTER — Ambulatory Visit
Admission: RE | Admit: 2016-08-27 | Discharge: 2016-08-27 | Disposition: A | Discharge status: Home / Self Care | Payer: Managed Care, Other (non HMO) | Source: Ambulatory Visit | Attending: Obstetrics & Gynecology | Admitting: Obstetrics & Gynecology

## 2016-08-27 DIAGNOSIS — Z1231 Encounter for screening mammogram for malignant neoplasm of breast: Secondary | ICD-10-CM

## 2016-09-27 ENCOUNTER — Encounter: Payer: Self-pay | Admitting: Obstetrics & Gynecology

## 2016-10-19 ENCOUNTER — Other Ambulatory Visit: Payer: Self-pay | Admitting: Obstetrics & Gynecology

## 2016-10-21 NOTE — Telephone Encounter (Signed)
Medication refill request: Levothyroxine Last AEX:  10/19/15 SM Next AEX: 02/06/17 SM Last MMG (if hormonal medication request): 08/27/16 BIRADS1  Refill authorized: 10/19/15 #90 4R. Please advise. Thank you.

## 2016-11-02 ENCOUNTER — Other Ambulatory Visit: Payer: Self-pay | Admitting: Obstetrics & Gynecology

## 2016-11-04 NOTE — Telephone Encounter (Signed)
Medication refill request: Zoloft  Last AEX:  10-19-15  Next AEX: 02-06-17  Last MMG (if hormonal medication request): 08-29-16 WNL  Refill authorized: please advise

## 2016-11-29 DIAGNOSIS — J209 Acute bronchitis, unspecified: Secondary | ICD-10-CM | POA: Diagnosis not present

## 2017-01-17 ENCOUNTER — Other Ambulatory Visit: Payer: Self-pay | Admitting: Obstetrics & Gynecology

## 2017-01-17 NOTE — Telephone Encounter (Signed)
Medication refill request: Levothyroxine Last AEX:  10/19/15 SM Next AEX: 02/06/17 SM Last MMG (if hormonal medication request): 08/27/16 BIRADS1, Density B, TBC Refill authorized: 10/22/16 #90 0R. Please advise. Thank you.   Routed to [facility].

## 2017-02-01 ENCOUNTER — Other Ambulatory Visit: Payer: Self-pay | Admitting: Obstetrics & Gynecology

## 2017-02-01 DIAGNOSIS — J209 Acute bronchitis, unspecified: Secondary | ICD-10-CM | POA: Diagnosis not present

## 2017-02-03 NOTE — Telephone Encounter (Signed)
Medication refill request: Sertraline 50mg  Last AEX:  10/19/15 SM Next AEX: 02/06/17 Last MMG (if hormonal medication request): 08/27/16 BIRADS 1 negative Refill authorized: 11/04/16 #90 w/0 refills; today please advise, patient has appointment coming up

## 2017-02-06 ENCOUNTER — Ambulatory Visit: Payer: Managed Care, Other (non HMO) | Admitting: Obstetrics & Gynecology

## 2017-02-13 ENCOUNTER — Other Ambulatory Visit: Payer: Self-pay | Admitting: Obstetrics & Gynecology

## 2017-02-13 ENCOUNTER — Other Ambulatory Visit: Payer: Self-pay | Admitting: Obstetrics and Gynecology

## 2017-02-17 NOTE — Telephone Encounter (Signed)
Medication refill request: Levothyroxine Last AEX:  10/19/15 SM Next AEX: 02/18/17 SM Last MMG (if hormonal medication request): 08/27/16 BIRADS1, Density B, TBC Refill authorized: 01/17/17 #30 0R. Please advise. Thank you.

## 2017-02-18 ENCOUNTER — Other Ambulatory Visit (HOSPITAL_COMMUNITY)
Admission: RE | Admit: 2017-02-18 | Discharge: 2017-02-18 | Disposition: A | Discharge status: Home / Self Care | Payer: BLUE CROSS/BLUE SHIELD | Source: Ambulatory Visit | Attending: Obstetrics & Gynecology | Admitting: Obstetrics & Gynecology

## 2017-02-18 ENCOUNTER — Encounter: Payer: Self-pay | Admitting: Obstetrics & Gynecology

## 2017-02-18 ENCOUNTER — Ambulatory Visit (INDEPENDENT_AMBULATORY_CARE_PROVIDER_SITE_OTHER): Payer: BLUE CROSS/BLUE SHIELD | Admitting: Obstetrics & Gynecology

## 2017-02-18 VITALS — BP 120/80 | HR 66 | Resp 16 | Ht 61.0 in | Wt 242.4 lb

## 2017-02-18 DIAGNOSIS — Z0000 Encounter for general adult medical examination without abnormal findings: Secondary | ICD-10-CM

## 2017-02-18 DIAGNOSIS — Z6841 Body Mass Index (BMI) 40.0 and over, adult: Secondary | ICD-10-CM

## 2017-02-18 DIAGNOSIS — Z124 Encounter for screening for malignant neoplasm of cervix: Secondary | ICD-10-CM

## 2017-02-18 DIAGNOSIS — Z01419 Encounter for gynecological examination (general) (routine) without abnormal findings: Secondary | ICD-10-CM

## 2017-02-18 LAB — CBC
HEMATOCRIT: 40.4 % (ref 35.0–45.0)
Hemoglobin: 13.4 g/dL (ref 11.7–15.5)
MCH: 28.8 pg (ref 27.0–33.0)
MCHC: 33.2 g/dL (ref 32.0–36.0)
MCV: 86.9 fL (ref 80.0–100.0)
MPV: 10.1 fL (ref 7.5–12.5)
Platelets: 267 10*3/uL (ref 140–400)
RBC: 4.65 MIL/uL (ref 3.80–5.10)
RDW: 14.7 % (ref 11.0–15.0)
WBC: 6.2 10*3/uL (ref 3.8–10.8)

## 2017-02-18 LAB — LIPID PANEL
Cholesterol: 254 mg/dL — ABNORMAL HIGH (ref <200)
HDL: 57 mg/dL (ref >50)
LDL Cholesterol: 173 mg/dL — ABNORMAL HIGH (ref <100)
TRIGLYCERIDES: 119 mg/dL (ref <150)
Total CHOL/HDL Ratio: 4.5 Ratio (ref <5.0)
VLDL: 24 mg/dL (ref <30)

## 2017-02-18 LAB — COMPREHENSIVE METABOLIC PANEL
ALBUMIN: 3.9 g/dL (ref 3.6–5.1)
ALT: 15 U/L (ref 6–29)
AST: 15 U/L (ref 10–35)
Alkaline Phosphatase: 53 U/L (ref 33–130)
BUN: 11 mg/dL (ref 7–25)
CALCIUM: 8.7 mg/dL (ref 8.6–10.4)
CO2: 28 mmol/L (ref 20–31)
Chloride: 106 mmol/L (ref 98–110)
Creat: 0.68 mg/dL (ref 0.50–1.05)
Glucose, Bld: 98 mg/dL (ref 65–99)
Potassium: 4.5 mmol/L (ref 3.5–5.3)
Sodium: 140 mmol/L (ref 135–146)
TOTAL PROTEIN: 6.6 g/dL (ref 6.1–8.1)
Total Bilirubin: 0.5 mg/dL (ref 0.2–1.2)

## 2017-02-18 LAB — TSH: TSH: 0.06 mIU/L — ABNORMAL LOW

## 2017-02-18 MED ORDER — SERTRALINE HCL 50 MG PO TABS: ORAL_TABLET | ORAL | 12 refills | Status: DC

## 2017-02-18 MED ORDER — LEVOTHYROXINE SODIUM 100 MCG PO TABS: 100.0000 ug | ORAL_TABLET | Freq: Every day | ORAL | 12 refills | Status: DC

## 2017-02-18 NOTE — Progress Notes (Signed)
56 y.o. G2P2 MarriedCaucasianF here for annual exam.  Doing well.  Granddaughter, Allayne Butcher, is going to be three in August.    Denies vaginal bleeding.    Really frustrated with weight.  Considering weight loss surgery.  Has  Patient's last menstrual period was 11/18/2005.          Sexually active: Yes.    The current method of family planning is status post hysterectomy.    Exercising: No.  The patient does not participate in regular exercise at present. Smoker:  no  Health Maintenance: Pap:  04/2006 neg History of abnormal Pap:  no MMG:  08/27/16 BIRADS1, Density B, TBC Colonoscopy: 2012 f/u 1yrs, Dr Loreta Ave BMD:  No TDaP:  05/18/2009 Hep C testing: 10/19/2015 Neg Screening Labs: Blood drawn today-fasting   reports that she has never smoked. She has never used smokeless tobacco. She reports that she does not drink alcohol or use drugs.  Past Medical History:  Diagnosis Date  . Anxiety and depression   . Carpal tunnel syndrome, bilateral   . History of blood transfusion    during T&A  . HSV infection    HSV I/II  . Hypothyroid   . Perirectal abscess 4/10   h/o  . Rheumatoid arthritis (HCC)    Dr. Nickola Major    Past Surgical History:  Procedure Laterality Date  . CARPAL TUNNEL RELEASE  12/15/09   and tendon clean out  . CESAREAN SECTION  1989, 1995  . INCISION AND DRAINAGE PERIRECTAL ABSCESS  4/10  . TONSILLECTOMY AND ADENOIDECTOMY    . TOTAL ABDOMINAL HYSTERECTOMY  9/07  . TUBAL LIGATION  1995    Current Outpatient Prescriptions  Medication Sig Dispense Refill  . levothyroxine (SYNTHROID, LEVOTHROID) 100 MCG tablet TAKE 1 TABLET(100 MCG) BY MOUTH DAILY 30 tablet 0  . montelukast (SINGULAIR) 10 MG tablet Take 10 mg by mouth daily.    . naproxen sodium (ANAPROX) 220 MG tablet Take 220 mg by mouth every morning.    . sertraline (ZOLOFT) 50 MG tablet TAKE 1 TABLET(50 MG) BY MOUTH DAILY 90 tablet 0   No current facility-administered medications for this visit.      Family History  Problem Relation Age of Onset  . Diabetes Maternal Grandfather   . Cancer Father     colon and bone  . Rheumatic fever Mother   . Bell's palsy Mother   . Heart disease Mother   . Heart attack Brother     over 48 years old    ROS:  Pertinent items are noted in HPI.  Otherwise, a comprehensive ROS was negative.  Exam:   BP 120/80 (BP Location: Right Arm, Patient Position: Sitting, Cuff Size: Normal)   Pulse 66   Resp 16   Ht 5\' 1"  (1.549 m)   Wt 242 lb 6.4 oz (110 kg)   LMP 11/18/2005   BMI 45.80 kg/m   Weight change: +2#   Height: 5\' 1"  (154.9 cm)  Ht Readings from Last 3 Encounters:  02/18/17 5\' 1"  (1.549 m)  10/19/15 5\' 1"  (1.549 m)  08/26/14 5\' 1"  (1.549 m)    General appearance: alert, cooperative and appears stated age Head: Normocephalic, without obvious abnormality, atraumatic Neck: no adenopathy, supple, symmetrical, trachea midline and thyroid normal to inspection and palpation Lungs: clear to auscultation bilaterally Breasts: normal appearance, no masses or tenderness Heart: regular rate and rhythm Abdomen: soft, non-tender; bowel sounds normal; no masses,  no organomegaly Extremities: extremities normal, atraumatic, no cyanosis or edema  Skin: Skin color, texture, turgor normal. No rashes or lesions Lymph nodes: Cervical, supraclavicular, and axillary nodes normal. No abnormal inguinal nodes palpated Neurologic: Grossly normal   Pelvic: External genitalia:  no lesions              Urethra:  normal appearing urethra with no masses, tenderness or lesions              Bartholins and Skenes: normal                 Vagina: normal appearing vagina with normal color and discharge, no lesions              Cervix: no lesions              Pap taken: Yes.   Bimanual Exam:  Uterus:  uterus absent              Adnexa: no mass, fullness, tenderness               Rectovaginal: Confirms               Anus:  normal sphincter tone, no  lesions  Chaperone was present for exam.  A:  Well Woman with normal exam H/O TAH due to fibroids/adenomyosis Depression Hypothyroidism Rheumatoid arthritis, not seeing any rheumatologist at this time H/O perirectal abscess 4/10 Obesity  P:   Mammogram guidelines reviewed pap smear obtained today CBC, Lipids, CMP, TSH, Vit D Treatment options for weight reviewed.  Considering bariatric surgery vs medication Zoloft 50mg  daily.  330/12 RF Synthroid daily.  330/12 RF return annually or prn   ~10 minutes spent spent with pt in face to face discussion of weight management.

## 2017-02-18 NOTE — Patient Instructions (Addendum)
Contrave  [facility].  Dr. Delrae Alfred

## 2017-02-18 NOTE — Addendum Note (Signed)
Addended by: Jerene Bears on: 02/18/2017 09:38 AM   Modules accepted: Orders

## 2017-02-19 ENCOUNTER — Telehealth: Payer: Self-pay | Admitting: *Deleted

## 2017-02-19 LAB — VITAMIN D 25 HYDROXY (VIT D DEFICIENCY, FRACTURES): Vit D, 25-Hydroxy: 16 ng/mL — ABNORMAL LOW (ref 30–100)

## 2017-02-19 NOTE — Telephone Encounter (Signed)
Attempted to reach patient. Unable to leave a message a line rang to a busy signal.

## 2017-02-19 NOTE — Telephone Encounter (Signed)
-----   Message from Jerene Bears, MD sent at 02/19/2017  6:28 AM EDT ----- Please let pt know her Vit D is really low and I would recommend that she take supplemental Vit D 50K weekly.  I think she will decline this.  Also, her TSH is low like she is getting too much thryoid medication.  I would repeat a TSH in a month to make sure this is real.  If so, her thyroid medication will need adjusting.  Her CMP and CBC were normal.  Lastly, her lipids were higher, still, this year at 254 and her LDLs were 173.  We discussed weight loss yesterday.  She is also considering weight loss.  If she does not have some significant weight loss this year, she really does need to start a cholesterol lowering medication.  She is going to let me know about starting a weight loss medication as well.

## 2017-02-20 ENCOUNTER — Telehealth: Payer: Self-pay

## 2017-02-20 ENCOUNTER — Other Ambulatory Visit: Payer: Self-pay

## 2017-02-20 MED ORDER — VITAMIN D (ERGOCALCIFEROL) 1.25 MG (50000 UNIT) PO CAPS: 50000.0000 [IU] | ORAL_CAPSULE | ORAL | 0 refills | Status: DC

## 2017-02-20 NOTE — Telephone Encounter (Signed)
Left message for patient to call Summer back.  

## 2017-02-20 NOTE — Telephone Encounter (Signed)
Vitamin D 50,000 IU weekly sent in to pharmacy per Dr. Hyacinth Meeker. See telephone encounter dated 02/20/17. -sco

## 2017-02-20 NOTE — Telephone Encounter (Signed)
-----   Message from Mary S Miller, MD sent at 02/19/2017  6:28 AM EDT ----- Please let pt know her Vit D is really low and I would recommend that she take supplemental Vit D 50K weekly.  I think she will decline this.  Also, her TSH is low like she is getting too much thryoid medication.  I would repeat a TSH in a month to make sure this is real.  If so, her thyroid medication will need adjusting.  Her CMP and CBC were normal.  Lastly, her lipids were higher, still, this year at 254 and her LDLs were 173.  We discussed weight loss yesterday.  She is also considering weight loss.  If she does not have some significant weight loss this year, she really does need to start a cholesterol lowering medication.  She is going to let me know about starting a weight loss medication as well. 

## 2017-02-20 NOTE — Telephone Encounter (Signed)
Spoke with Carla Lawrence and advised of all lab results. Carla Lawrence request a prescription for Vit D 50,000 IU be sent into confirmed pharmacy on file. Prescription sent in to [facility] on [address] in [place]. Appointment for TSH recheck in 1 month was scheduled. Carla Lawrence is requesting weight loss medication be sent in. Carla Lawrence does know that Dr. Hyacinth Meeker is out of the office for the afternoon and will get to this when she returns. Carla Lawrence verbalized understanding and agreement to everything discussed.  Routing to provider for review and encounter closed.

## 2017-02-20 NOTE — Telephone Encounter (Signed)
Patient called stating she would like to try the weight loss medication you spoke about at her AEX.   Thank you, Summer

## 2017-02-20 NOTE — Telephone Encounter (Signed)
Erroneous entry

## 2017-02-21 LAB — CYTOLOGY - PAP: Diagnosis: NEGATIVE

## 2017-02-21 NOTE — Telephone Encounter (Signed)
See result note, patient was notified by S. O'Neal, CMA.   Routing to provider for final review. Patient agreeable to disposition. Will close encounter.

## 2017-02-24 ENCOUNTER — Other Ambulatory Visit: Payer: Self-pay | Admitting: Obstetrics & Gynecology

## 2017-02-24 MED ORDER — NALTREXONE-BUPROPION HCL ER 8-90 MG PO TB12: ORAL_TABLET | ORAL | 0 refills | Status: DC

## 2017-02-24 NOTE — Telephone Encounter (Signed)
Left detailed message on vm (per DPR) and advised prescription was faxed to pharmacy on file. Also, patient needs to call the office to schedule a 1 month follow-up appointment with Dr. Hyacinth Meeker.

## 2017-02-24 NOTE — Telephone Encounter (Signed)
Rx printed.   Ok to fax to pharmacy on file and let pt know this has been done.  Needs follow up in one month.

## 2017-02-25 NOTE — Telephone Encounter (Signed)
Spoke with patient and advised of pap smear results. Also advised patient that she needs to schedule a 1 month follow-up appointment for the [facility] prescription. Patient states she needs a PA and will try medication if approved.

## 2017-03-07 DIAGNOSIS — H9213 Otorrhea, bilateral: Secondary | ICD-10-CM | POA: Diagnosis not present

## 2017-03-07 DIAGNOSIS — Z6841 Body Mass Index (BMI) 40.0 and over, adult: Secondary | ICD-10-CM | POA: Diagnosis not present

## 2017-03-13 ENCOUNTER — Other Ambulatory Visit: Payer: Self-pay | Admitting: Obstetrics & Gynecology

## 2017-03-19 ENCOUNTER — Other Ambulatory Visit: Payer: Self-pay

## 2017-03-20 ENCOUNTER — Other Ambulatory Visit: Payer: Self-pay

## 2017-03-25 ENCOUNTER — Other Ambulatory Visit: Payer: BLUE CROSS/BLUE SHIELD

## 2017-03-25 DIAGNOSIS — R7989 Other specified abnormal findings of blood chemistry: Secondary | ICD-10-CM

## 2017-03-25 DIAGNOSIS — R946 Abnormal results of thyroid function studies: Secondary | ICD-10-CM | POA: Diagnosis not present

## 2017-03-25 LAB — TSH: TSH: 0.02 mIU/L — ABNORMAL LOW

## 2017-03-26 ENCOUNTER — Telehealth: Payer: Self-pay | Admitting: *Deleted

## 2017-03-26 DIAGNOSIS — E039 Hypothyroidism, unspecified: Secondary | ICD-10-CM

## 2017-03-26 NOTE — Telephone Encounter (Signed)
-----   Message from Jerene Bears, MD sent at 03/26/2017  6:08 AM EDT ----- Please let pt know her TSH is still low showing she is either receiving too much thryoid medication or something else may be occurring with her thyroid. I know she is very concerned about weight and I think she should see an endocrinologist.  Would she be okay with me referring her?  If yes, please place referral to Dr. Everardo All.  Thanks.

## 2017-03-26 NOTE — Telephone Encounter (Signed)
Spoke with patient, advised of results and recommendations as seen below per Dr. Hyacinth Meeker. Patient accepts referral to endocrinology. Advised patient referral placed, our referral department will f/u with scheduling. Patient verbalizes understanding and is agreeable.  Routing to provider for final review. Patient is agreeable to disposition. Will close encounter.  Cc: Braxton Feathers

## 2017-03-26 NOTE — Telephone Encounter (Signed)
Left message to call Guilianna Mckoy at [phone].  

## 2017-04-15 ENCOUNTER — Other Ambulatory Visit: Payer: Self-pay | Admitting: Obstetrics & Gynecology

## 2017-04-15 ENCOUNTER — Ambulatory Visit (INDEPENDENT_AMBULATORY_CARE_PROVIDER_SITE_OTHER): Payer: BLUE CROSS/BLUE SHIELD | Admitting: Obstetrics & Gynecology

## 2017-04-15 MED ORDER — NALTREXONE-BUPROPION HCL ER 8-90 MG PO TB12: 2.0000 | ORAL_TABLET | Freq: Two times a day (BID) | ORAL | 4 refills | Status: AC

## 2017-04-15 NOTE — Progress Notes (Signed)
GYNECOLOGY  VISIT   HPI: 56 y.o. G2P2 Married Caucasian female here for follow up after starting contrave.  She is feeling like food isn't as appealing.  She is having some mild hot flashes.  She is up to the full dosage.  Denies other side effects which were reviewed with her from Up To Date information.  Pt continues to have abnormal TSH values.  She has been referred to Dr. Everardo All and has visit scheduled.  GYNECOLOGIC HISTORY: Patient's last menstrual period was 11/18/2005. Contraception: Hysterectomy Menopausal hormone therapy: none  Patient Active Problem List   Diagnosis Date Noted  . Rheumatoid arthritis (HCC) 08/26/2014    Past Medical History:  Diagnosis Date  . Anxiety and depression   . Carpal tunnel syndrome, bilateral   . History of blood transfusion    during T&A  . HSV infection    HSV I/II  . Hypothyroid   . Perirectal abscess 4/10   h/o  . Rheumatoid arthritis (HCC)    Dr. Nickola Major    Past Surgical History:  Procedure Laterality Date  . CARPAL TUNNEL RELEASE  12/15/09   and tendon clean out  . CESAREAN SECTION  1989, 1995  . INCISION AND DRAINAGE PERIRECTAL ABSCESS  4/10  . TONSILLECTOMY AND ADENOIDECTOMY    . TOTAL ABDOMINAL HYSTERECTOMY  9/07  . TUBAL LIGATION  1995    MEDS:  Reviewed in EPIC and UTD  ALLERGIES: Sulfa antibiotics  Family History  Problem Relation Age of Onset  . Diabetes Maternal Grandfather   . Cancer Father        colon and bone  . Rheumatic fever Mother   . Bell's palsy Mother   . Heart disease Mother   . Heart attack Brother        over 53 years old    SH:  Married, non smoker  ROS  PHYSICAL EXAMINATION:    BP (!) 142/80 (BP Location: Right Arm, Patient Position: Sitting, Cuff Size: Large)   Pulse 86   Resp 16   Ht 5\' 1"  (1.549 m)   Wt 240 lb (108.9 kg)   LMP 11/18/2005   BMI 45.35 kg/m     General appearance: alert, cooperative and appears stated age Neck: no adenopathy, supple, symmetrical, trachea  midline and thyroid normal to inspection and palpation CV:  Regular rate and rhythm Lungs:  clear to auscultation, no wheezes, rales or rhonchi, symmetric air entry  Chaperone was present for exam.  Assessment: Obesity, on Contrave and now at full dosage.   BMD 45.3 Minimal weight loss thus far  Plan: Will continue at full dosage.  Pt understands if has not lost 5% of body weight at 12 weeks, should discontinue.  Recheck 2 months.   I really feel she should contemplate weight loss surgery but she does not have a desire to proceed with this currently (or even go to an information session).   ~15 minutes spent with patient >50% of time was in face to face discussion of above.

## 2017-04-16 NOTE — Telephone Encounter (Signed)
Medication refill request: Vitamin D Last AEX:  02/18/17 SM Next AEX: 05/07/18 SM Last MMG (if hormonal medication request): 08/27/16 BIRADS1, Density B, [facility] Refill authorized: 02/20/17 #12 0R. Please advise. Thank you.   Routing to BS since MS is out today.

## 2017-04-17 ENCOUNTER — Encounter: Payer: Self-pay | Admitting: Obstetrics & Gynecology

## 2017-05-02 DIAGNOSIS — M25562 Pain in left knee: Secondary | ICD-10-CM | POA: Diagnosis not present

## 2017-05-12 ENCOUNTER — Other Ambulatory Visit: Payer: Self-pay | Admitting: Obstetrics & Gynecology

## 2017-05-15 ENCOUNTER — Encounter (INDEPENDENT_AMBULATORY_CARE_PROVIDER_SITE_OTHER): Payer: Self-pay

## 2017-05-15 ENCOUNTER — Ambulatory Visit (INDEPENDENT_AMBULATORY_CARE_PROVIDER_SITE_OTHER): Payer: BLUE CROSS/BLUE SHIELD | Admitting: Endocrinology

## 2017-05-15 ENCOUNTER — Encounter: Payer: Self-pay | Admitting: Endocrinology

## 2017-05-15 DIAGNOSIS — E038 Other specified hypothyroidism: Secondary | ICD-10-CM | POA: Diagnosis not present

## 2017-05-15 DIAGNOSIS — E063 Autoimmune thyroiditis: Secondary | ICD-10-CM

## 2017-05-15 DIAGNOSIS — E039 Hypothyroidism, unspecified: Secondary | ICD-10-CM | POA: Insufficient documentation

## 2017-05-15 MED ORDER — LEVOTHYROXINE SODIUM 75 MCG PO TABS: 75.0000 ug | ORAL_TABLET | Freq: Every day | ORAL | 3 refills | Status: DC

## 2017-05-15 NOTE — Progress Notes (Signed)
Subjective:    Patient ID: Carla Lawrence, female    DOB: 1961/06/09, 56 y.o.   MRN: [id number]  HPI Pt is referred by Dr Tresa Endo, for hypothyroidism.  Pt reports hypothyroidism was dx'ed in 1973.  she has been on prescribed thyroid hormone therapy since then.  she has never taken kelp or any other type of non-prescribed thyroid product.  she has never had thyroid imaging.  she has never had thyroid surgery, or XRT to the neck.  she has never been on amiodarone or lithium.  She has been on her current 100 mcg/d, x approx 2 years.  She has slightly dry skin, and assoc easy bruising.  She says her dosage has varied very little over the years.   Past Medical History:  Diagnosis Date  . Anxiety and depression   . Carpal tunnel syndrome, bilateral   . History of blood transfusion    during T&A  . HSV infection    HSV I/II  . Hypothyroid   . Perirectal abscess 4/10   h/o  . Rheumatoid arthritis (HCC)    Dr. Nickola Major    Past Surgical History:  Procedure Laterality Date  . CARPAL TUNNEL RELEASE  12/15/09   and tendon clean out  . CESAREAN SECTION  1989, 1995  . INCISION AND DRAINAGE PERIRECTAL ABSCESS  4/10  . TONSILLECTOMY AND ADENOIDECTOMY    . TOTAL ABDOMINAL HYSTERECTOMY  9/07  . TUBAL LIGATION  1995    Social History   Social History  . Marital status: Married    Spouse name: N/A  . Number of children: N/A  . Years of education: N/A   Occupational History  . Not on file.   Social History Main Topics  . Smoking status: Never Smoker  . Smokeless tobacco: Never Used  . Alcohol use No  . Drug use: No  . Sexual activity: Yes    Partners: Male    Birth control/ protection: Surgical, Spermicide     Comment: TAH   Other Topics Concern  . Not on file   Social History Narrative  . No narrative on file    Current Outpatient Prescriptions on File Prior to Visit  Medication Sig Dispense Refill  . montelukast (SINGULAIR) 10 MG tablet Take 10 mg by mouth daily.    . naproxen  sodium (ANAPROX) 220 MG tablet Take 220 mg by mouth every morning.    . sertraline (ZOLOFT) 50 MG tablet TAKE 1 TABLET(50 MG) BY MOUTH DAILY 30 tablet 12  . Vitamin D, Ergocalciferol, (DRISDOL) 50000 units CAPS capsule TAKE 1 CAPSULE BY MOUTH EVERY 7 DAYS 12 capsule 0   No current facility-administered medications on file prior to visit.     Allergies  Allergen Reactions  . Sulfa Antibiotics Rash    Family History  Problem Relation Age of Onset  . Diabetes Maternal Grandfather   . Cancer Father        colon and bone  . Rheumatic fever Mother   . Bell's palsy Mother   . Heart disease Mother   . Heart attack Brother        over 62 years old  . Thyroid disease Neg Hx     BP 134/86   Pulse 77   Ht 5\' 1"  (1.549 m)   Wt 236 lb (107 kg)   LMP 11/18/2005   SpO2 93%   BMI 44.59 kg/m    Review of Systems denies depression, hair loss, muscle cramps, sob, constipation, numbness, blurry vision,  cold intolerance, myalgias, rhinorrhea, and syncope.  She has excessive diaphoresis and fatigue.      Objective:   Physical Exam VS: see vs page GEN: no distress HEAD: head: no deformity eyes: no periorbital swelling; slight bilat proptosis.   external nose and ears are normal mouth: no lesion seen NECK: supple, thyroid is not enlarged CHEST WALL: no deformity LUNGS: clear to auscultation CV: reg rate and rhythm, no murmur ABD: abdomen is soft, nontender.  no hepatosplenomegaly.  not distended.  no hernia MUSCULOSKELETAL: muscle bulk and strength are grossly normal.  no obvious joint swelling.  gait is normal and steady EXTEMITIES: no deformity.  no edema PULSES: no carotid bruit NEURO:  cn 2-12 grossly intact.   readily moves all 4's.  sensation is intact to touch on all 4's SKIN:  Normal texture and temperature.  No rash or suspicious lesion is visible.   NODES:  None palpable at the neck PSYCH: alert, well-oriented.  Does not appear anxious nor depressed.    Lab Results    Component Value Date   TSH 0.02 (L) 03/25/2017   I have reviewed outside records, and summarized: Pt was noted to have elevated a1c, and referred here.  She was recently rx'ed Contrave, so Dr Hyacinth Meeker raised the question of synthroid dosage.      Assessment & Plan:  Hypothyroidism, new to me, slightly overreplaced.    Patient Instructions  Please reduce the thyroid pill.  I have sent a prescription to your pharmacy Please come back to recheck the blood test in 1 month.  Please return in 1 year.

## 2017-05-15 NOTE — Patient Instructions (Addendum)
Please reduce the thyroid pill.  I have sent a prescription to your pharmacy Please come back to recheck the blood test in 1 month.  Please return in 1 year.

## 2017-05-27 ENCOUNTER — Ambulatory Visit: Payer: BLUE CROSS/BLUE SHIELD | Admitting: Obstetrics & Gynecology

## 2017-05-27 ENCOUNTER — Telehealth: Payer: Self-pay | Admitting: Obstetrics & Gynecology

## 2017-05-27 ENCOUNTER — Encounter: Payer: Self-pay | Admitting: Obstetrics & Gynecology

## 2017-05-27 NOTE — Telephone Encounter (Signed)
Patient rescheduled 6 week recheck today because of a work conflict. Rescheduled to 06/17/17 at 3:00.

## 2017-06-16 ENCOUNTER — Other Ambulatory Visit (INDEPENDENT_AMBULATORY_CARE_PROVIDER_SITE_OTHER): Payer: BLUE CROSS/BLUE SHIELD

## 2017-06-16 DIAGNOSIS — E038 Other specified hypothyroidism: Secondary | ICD-10-CM | POA: Diagnosis not present

## 2017-06-16 DIAGNOSIS — E063 Autoimmune thyroiditis: Secondary | ICD-10-CM | POA: Diagnosis not present

## 2017-06-16 LAB — TSH: TSH: 2.44 u[IU]/mL (ref 0.35–4.50)

## 2017-06-16 LAB — T4, FREE: FREE T4: 0.64 ng/dL (ref 0.60–1.60)

## 2017-06-17 ENCOUNTER — Ambulatory Visit: Payer: BLUE CROSS/BLUE SHIELD | Admitting: Obstetrics & Gynecology

## 2017-06-30 ENCOUNTER — Ambulatory Visit (INDEPENDENT_AMBULATORY_CARE_PROVIDER_SITE_OTHER): Payer: BLUE CROSS/BLUE SHIELD | Admitting: Obstetrics & Gynecology

## 2017-06-30 ENCOUNTER — Encounter: Payer: Self-pay | Admitting: Obstetrics & Gynecology

## 2017-06-30 MED ORDER — BUPROPION HCL ER (XL) 150 MG PO TB24: 150.0000 mg | ORAL_TABLET | Freq: Every day | ORAL | 2 refills | Status: DC

## 2017-06-30 NOTE — Progress Notes (Signed)
GYNECOLOGY  VISIT   HPI: 56 y.o. G2P2 Married Caucasian female here for follow-up after using weight loss medication.  Has not lost any weight and knows she needs to stop the medication.  However, feels mood is much better.  Aware there is wellbutrin in this combination so would like to continue it.  Reviewed current dosing and change in dosage to just wellbutrin XL.  Pt is comfortable with this.  We reviewed weight loss surgery, again.  She has still not gone to any information sessions.  Admits fear about surgery.  D/w pt that I do not want her to feel I am pushing her to do something she does not want to do.  States she knows she needs to lose weight just not sure she can do it.  Advised I'm here to help if she changes her mind.  GYNECOLOGIC HISTORY: Patient's last menstrual period was 11/18/2005. Contraception: hysterectomy Menopausal hormone therapy: none  Patient Active Problem List   Diagnosis Date Noted  . Hypothyroidism 05/15/2017  . Rheumatoid arthritis (HCC) 08/26/2014    Past Medical History:  Diagnosis Date  . Anxiety and depression   . Carpal tunnel syndrome, bilateral   . History of blood transfusion    during T&A  . HSV infection    HSV I/II  . Hypothyroid   . Perirectal abscess 4/10   h/o  . Rheumatoid arthritis (HCC)    Dr. Nickola Major    Past Surgical History:  Procedure Laterality Date  . CARPAL TUNNEL RELEASE  12/15/09   and tendon clean out  . CESAREAN SECTION  1989, 1995  . INCISION AND DRAINAGE PERIRECTAL ABSCESS  4/10  . TONSILLECTOMY AND ADENOIDECTOMY    . TOTAL ABDOMINAL HYSTERECTOMY  9/07  . TUBAL LIGATION  1995    MEDS:  Current Outpatient Prescriptions on File Prior to Visit  Medication Sig Dispense Refill  . levothyroxine (SYNTHROID, LEVOTHROID) 75 MCG tablet Take 1 tablet (75 mcg total) by mouth daily. 30 tablet 3  . montelukast (SINGULAIR) 10 MG tablet Take 10 mg by mouth daily.    . naproxen sodium (ANAPROX) 220 MG tablet Take 220 mg by  mouth every morning.    . sertraline (ZOLOFT) 50 MG tablet TAKE 1 TABLET(50 MG) BY MOUTH DAILY 30 tablet 12  . Vitamin D, Ergocalciferol, (DRISDOL) 50000 units CAPS capsule TAKE 1 CAPSULE BY MOUTH EVERY 7 DAYS 12 capsule 0   No current facility-administered medications on file prior to visit.      ALLERGIES: Sulfa antibiotics  Family History  Problem Relation Age of Onset  . Diabetes Maternal Grandfather   . Cancer Father        colon and bone  . Rheumatic fever Mother   . Bell's palsy Mother   . Heart disease Mother   . Heart attack Brother        over 56 years old  . Thyroid disease Neg Hx     SH:  Married, non smoker  Review of Systems  Constitutional: Negative.   Respiratory: Negative.   Cardiovascular: Negative.   Psychiatric/Behavioral: Negative.     PHYSICAL EXAMINATION:    BP (!) 154/90 (BP Location: Right Arm, Patient Position: Sitting, Cuff Size: Large)   Pulse 66   Resp 14   Ht 5\' 1"  (1.549 m)   Wt 238 lb 4 oz (108.1 kg)   LMP 11/18/2005   BMI 45.02 kg/m     General appearance: alert, cooperative and appears stated age No other exam performed  Assessment: Severe obesity Mood improvement with medication  Plan: Will stop Contrave Pt will transition to Wellbutrin 150mg  XL daily.  Rx to pharmacy.     ~15 minutes spent with patient >50% of time was in face to face discussion of above.

## 2017-08-08 ENCOUNTER — Emergency Department (HOSPITAL_COMMUNITY)
Admission: EM | Admit: 2017-08-08 | Discharge: 2017-08-08 | Disposition: A | Discharge status: Home / Self Care | Payer: BLUE CROSS/BLUE SHIELD | Attending: Emergency Medicine | Admitting: Emergency Medicine

## 2017-08-08 ENCOUNTER — Encounter (HOSPITAL_COMMUNITY): Payer: Self-pay

## 2017-08-08 ENCOUNTER — Emergency Department (HOSPITAL_BASED_OUTPATIENT_CLINIC_OR_DEPARTMENT_OTHER)
Admit: 2017-08-08 | Discharge: 2017-08-08 | Disposition: A | Discharge status: Home / Self Care | Payer: BLUE CROSS/BLUE SHIELD | Attending: Emergency Medicine | Admitting: Emergency Medicine

## 2017-08-08 DIAGNOSIS — M25561 Pain in right knee: Secondary | ICD-10-CM | POA: Diagnosis not present

## 2017-08-08 DIAGNOSIS — M79609 Pain in unspecified limb: Secondary | ICD-10-CM

## 2017-08-08 DIAGNOSIS — Z79899 Other long term (current) drug therapy: Secondary | ICD-10-CM | POA: Insufficient documentation

## 2017-08-08 DIAGNOSIS — M79604 Pain in right leg: Secondary | ICD-10-CM | POA: Diagnosis present

## 2017-08-08 DIAGNOSIS — E039 Hypothyroidism, unspecified: Secondary | ICD-10-CM | POA: Diagnosis not present

## 2017-08-08 MED ORDER — HYDROCODONE-ACETAMINOPHEN 5-325 MG PO TABS
1.0000 | ORAL_TABLET | Freq: Once | ORAL | Status: AC
  Administered 2017-08-08: 1 via ORAL
  Filled 2017-08-08: qty 1

## 2017-08-08 MED ORDER — HYDROCODONE-ACETAMINOPHEN 5-325 MG PO TABS: 1.0000 | ORAL_TABLET | ORAL | 0 refills | Status: DC | PRN

## 2017-08-08 NOTE — ED Provider Notes (Signed)
[facility]-EMERGENCY DEPT Provider Note   CSN: [id number] Arrival date & time: 08/08/17  1042     History   Chief Complaint Chief Complaint  Patient presents with  . Leg Pain    HPI Carla Lawrence is a 56 y.o. female.  She presents for evaluation of right leg pain with cramping and painful weightbearing for 1 day.  No known trauma.  History of similar pain, left knee, requiring an injection, but the pain in the right leg is worse.  She reports having imaging of both knees done 2 months ago that did not show any significant arthritis, or fractures.  She denies fever, chills, weakness or dizziness.  She has tried ibuprofen for the discomfort, without relief.  There are no other known modifying factors.   HPI  Past Medical History:  Diagnosis Date  . Anxiety and depression   . Carpal tunnel syndrome, bilateral   . History of blood transfusion    during T&A  . HSV infection    HSV I/II  . Hypothyroid   . Perirectal abscess 4/10   h/o  . Rheumatoid arthritis (HCC)    Dr. Nickola Lawrence    Patient Active Problem List   Diagnosis Date Noted  . Hypothyroidism 05/15/2017  . Rheumatoid arthritis (HCC) 08/26/2014    Past Surgical History:  Procedure Laterality Date  . CARPAL TUNNEL RELEASE  12/15/09   and tendon clean out  . CESAREAN SECTION  1989, 1995  . INCISION AND DRAINAGE PERIRECTAL ABSCESS  4/10  . TONSILLECTOMY AND ADENOIDECTOMY    . TOTAL ABDOMINAL HYSTERECTOMY  9/07  . TUBAL LIGATION  1995    OB History    Gravida Para Term Preterm AB Living   2 2       2    SAB TAB Ectopic Multiple Live Births                   Home Medications    Prior to Admission medications   Medication Sig Start Date End Date Taking? Authorizing Provider  buPROPion (WELLBUTRIN XL) 150 MG 24 hr tablet Take 1 tablet (150 mg total) by mouth daily. 06/30/17   07/02/17, MD  HYDROcodone-acetaminophen (NORCO) 5-325 MG tablet Take 1-2 tablets by mouth every 4 (four) hours as needed. 08/08/17    08/10/17, MD  levothyroxine (SYNTHROID, LEVOTHROID) 75 MCG tablet Take 1 tablet (75 mcg total) by mouth daily. 05/15/17   05/17/17, MD  montelukast (SINGULAIR) 10 MG tablet Take 10 mg by mouth daily.    [provider]  naproxen sodium (ANAPROX) 220 MG tablet Take 220 mg by mouth every morning.    [provider]  sertraline (ZOLOFT) 50 MG tablet TAKE 1 TABLET(50 MG) BY MOUTH DAILY 02/18/17   04/20/17, MD  Vitamin D, Ergocalciferol, (DRISDOL) 50000 units CAPS capsule TAKE 1 CAPSULE BY MOUTH EVERY 7 DAYS 04/16/17   04/18/17, MD    Family History Family History  Problem Relation Age of Onset  . Diabetes Maternal Grandfather   . Cancer Father        colon and bone  . Rheumatic fever Mother   . Bell's palsy Mother   . Heart disease Mother   . Heart attack Brother        over 23 years old  . Thyroid disease Neg Hx     Social History Social History  Substance Use Topics  . Smoking status: Never Smoker  . Smokeless tobacco:  Never Used  . Alcohol use No     Allergies   Sulfa antibiotics   Review of Systems Review of Systems  All other systems reviewed and are negative.    Physical Exam Updated Vital Signs BP (!) 165/74 (BP Location: Left Arm)   Pulse 69   Temp 97.6 F (36.4 C) (Oral)   Resp 18   Ht 5\' 2"  (1.575 m)   Wt 108 kg (238 lb)   LMP 11/18/2005   SpO2 100%   BMI 43.53 kg/m   Physical Exam  Constitutional: She is oriented to person, place, and time. She appears well-developed. She appears distressed (She is uncomfortable).  Overweight  HENT:  Head: Normocephalic and atraumatic.  Eyes: Pupils are equal, round, and reactive to light. Conjunctivae and EOM are normal.  Neck: Normal range of motion and phonation normal. Neck supple.  Cardiovascular: Normal rate.   Pulmonary/Chest: Effort normal.  Abdominal: Soft.  Musculoskeletal:  She can extend the right leg off the stretcher, and flex the knee to about 150  then resists further flexion secondary to pain.  Tenderness right knee without palpable effusion.  Mildly tender right calf and right pretibial space.  No deformity of the right leg or overlying skin changes.  Neurological: She is alert and oriented to person, place, and time. She exhibits normal muscle tone.  Skin: Skin is warm and dry.  Psychiatric: She has a normal mood and affect. Her behavior is normal. Judgment and thought content normal.  Nursing note and vitals reviewed.    ED Treatments / Results  Labs (all labs ordered are listed, but only abnormal results are displayed) Labs Reviewed - No data to display  EKG  EKG Interpretation None       Radiology No results found.  Procedures Procedures (including critical care time)  Medications Ordered in ED Medications  HYDROcodone-acetaminophen (NORCO/VICODIN) 5-325 MG per tablet 1 tablet (1 tablet Oral Given 08/08/17 1614)     Initial Impression / Assessment and Plan / ED Course  I have reviewed the triage vital signs and the nursing notes.  Pertinent labs & imaging results that were available during my care of the patient were reviewed by me and considered in my medical decision making (see chart for details).  Clinical Course as of Aug 09 1635  Fri Aug 08, 2017  1631 The patient was seen initially by me at triage, where I ordered a venous ultrasound of the right leg.  Labs were initially ordered, but were never done.  I again saw the patient after she was placed in the treatment room following return of her ultrasound imaging.  [EW]    Clinical Course User Index [EW] 1632, MD     Patient Vitals for the past 24 hrs:  BP Temp Temp src Pulse Resp SpO2 Height Weight  08/08/17 1624 (!) 165/74 - - 69 18 100 % - -  08/08/17 1320 (!) 144/84 97.6 F (36.4 C) Oral 69 17 98 % - -  08/08/17 1050 (!) 152/80 97.7 F (36.5 C) Oral 78 17 99 % 5\' 2"  (1.575 m) 108 kg (238 lb)    Knee immobilizer ordered and placed by  orthopedic technician for comfort with ambulation.   4:33 PM Reevaluation with update and discussion. After initial assessment and treatment, an updated evaluation reveals no change in clinical exam.  She remains uncomfortable.  Findings discussed with the patient, and husband, all questions answered. Carla Lawrence       Final Clinical  Impressions(s) / ED Diagnoses   Final diagnoses:  Acute pain of right knee   Nonspecific right leg pain with localizing symptoms to the right knee.  Suspect a primary right knee problem.  No trauma to be suspicious for fracture, or ligamentous injury.  No evidence for joint infection. Treatment will be symptomatic with knee immobilizer and observation.  Nursing Notes Reviewed/ Care Coordinated Applicable Imaging Reviewed Interpretation of Laboratory Data incorporated into ED treatment  The patient appears reasonably screened and/or stabilized for discharge and I doubt any other medical condition or other [facility] requiring further screening, evaluation, or treatment in the ED at this time prior to discharge.  Plan: Home Medications-OTC analgesia.; Home Treatments-knee immobilizer and heat as needed; return here if the recommended treatment, does not improve the symptoms; Recommended follow up-orthopedic follow-up 3 or 4 days.   New Prescriptions New Prescriptions   HYDROCODONE-ACETAMINOPHEN (NORCO) 5-325 MG TABLET    Take 1-2 tablets by mouth every 4 (four) hours as needed.     Mancel Bale, MD 08/08/17 [phone]

## 2017-08-08 NOTE — ED Provider Notes (Signed)
MSE was initiated and I personally evaluated the patient and placed orders (if any) at  11:25 AM on August 08, 2017.  Right lower leg tender and swollen. Pt. Is obese  The patient appears stable so that the remainder of the MSE may be completed by another provider.   Mancel Bale, MD 08/08/17 1126

## 2017-08-08 NOTE — Progress Notes (Signed)
Orthopedic Tech Progress Note Patient Details:  Carla Lawrence Mar 27, 1961 [id number]  Ortho Devices Type of Ortho Device: Knee Immobilizer Ortho Device/Splint Location: rle Ortho Device/Splint Interventions: Application   Nikki Dom 08/08/2017, 4:43 PM

## 2017-08-08 NOTE — ED Notes (Signed)
Dr. Effie Shy at bedside, he stated not needed to draw labs currently ordered.  Is going to order knee immobilizer, I called ortho tech to come down and apply brace.

## 2017-08-08 NOTE — Discharge Instructions (Signed)
Wear the immobilizer when up. Use heat on the sore area 3-4 times a day. Do not drive or work when taking the pain medicine.

## 2017-08-08 NOTE — ED Notes (Signed)
Patient given discharge instructions and verbalized understanding.  Patient stable to discharge at this time.  Patient is alert and oriented to baseline.  No distressed noted at this time.  All belongings taken with the patient at discharge.   

## 2017-08-08 NOTE — Progress Notes (Signed)
*  PRELIMINARY RESULTS* Vascular Ultrasound Right lower extremity venous duplex has been completed.  Preliminary findings: No evidence of deep vein thrombosis or baker's cyst on the right   Carla Lawrence 08/08/2017, 12:56 PM

## 2017-08-08 NOTE — ED Notes (Signed)
Ortho tech paged for knee immobilizer placement

## 2017-08-08 NOTE — ED Notes (Signed)
Give discharge instructions waiting on Othro to apply knee immobilizer.

## 2017-08-08 NOTE — ED Triage Notes (Signed)
Pt presents to the er for persistent cramping in her right leg that is now difficult to put her weight on, pt is also having some swelling in her right calf. Pulses present and strong bilaterally.

## 2017-08-09 ENCOUNTER — Other Ambulatory Visit: Payer: Self-pay | Admitting: Obstetrics and Gynecology

## 2017-08-11 DIAGNOSIS — M25561 Pain in right knee: Secondary | ICD-10-CM | POA: Diagnosis not present

## 2017-08-11 NOTE — Telephone Encounter (Signed)
Medication refill request: Vitamin D Last AEX:  02-18-17  Next AEX: 05-07-18 Last MMG (if hormonal medication request): 10-10-17WNL Refill authorized: please advise

## 2017-08-15 DIAGNOSIS — M25561 Pain in right knee: Secondary | ICD-10-CM | POA: Diagnosis not present

## 2017-08-20 DIAGNOSIS — M25561 Pain in right knee: Secondary | ICD-10-CM | POA: Diagnosis not present

## 2017-08-20 DIAGNOSIS — M1711 Unilateral primary osteoarthritis, right knee: Secondary | ICD-10-CM | POA: Diagnosis not present

## 2017-08-20 DIAGNOSIS — S83241D Other tear of medial meniscus, current injury, right knee, subsequent encounter: Secondary | ICD-10-CM | POA: Diagnosis not present

## 2017-09-09 ENCOUNTER — Other Ambulatory Visit: Payer: Self-pay | Admitting: Endocrinology

## 2017-09-18 HISTORY — PX: KNEE SURGERY: SHX244

## 2017-09-22 DIAGNOSIS — M23321 Other meniscus derangements, posterior horn of medial meniscus, right knee: Secondary | ICD-10-CM | POA: Diagnosis not present

## 2017-09-22 DIAGNOSIS — S83231A Complex tear of medial meniscus, current injury, right knee, initial encounter: Secondary | ICD-10-CM | POA: Diagnosis not present

## 2017-09-22 DIAGNOSIS — M1711 Unilateral primary osteoarthritis, right knee: Secondary | ICD-10-CM | POA: Diagnosis not present

## 2017-09-22 DIAGNOSIS — M948X6 Other specified disorders of cartilage, lower leg: Secondary | ICD-10-CM | POA: Diagnosis not present

## 2017-09-22 DIAGNOSIS — G8918 Other acute postprocedural pain: Secondary | ICD-10-CM | POA: Diagnosis not present

## 2017-10-06 DIAGNOSIS — M25661 Stiffness of right knee, not elsewhere classified: Secondary | ICD-10-CM | POA: Diagnosis not present

## 2017-10-08 DIAGNOSIS — M25661 Stiffness of right knee, not elsewhere classified: Secondary | ICD-10-CM | POA: Diagnosis not present

## 2017-10-11 ENCOUNTER — Other Ambulatory Visit: Payer: Self-pay | Admitting: Internal Medicine

## 2017-10-13 ENCOUNTER — Other Ambulatory Visit: Payer: Self-pay | Admitting: Obstetrics & Gynecology

## 2017-10-13 DIAGNOSIS — Z23 Encounter for immunization: Secondary | ICD-10-CM | POA: Diagnosis not present

## 2017-10-13 DIAGNOSIS — Z1231 Encounter for screening mammogram for malignant neoplasm of breast: Secondary | ICD-10-CM

## 2017-10-14 DIAGNOSIS — M25661 Stiffness of right knee, not elsewhere classified: Secondary | ICD-10-CM | POA: Diagnosis not present

## 2017-10-15 ENCOUNTER — Ambulatory Visit
Admission: RE | Admit: 2017-10-15 | Discharge: 2017-10-15 | Disposition: A | Discharge status: Home / Self Care | Payer: BLUE CROSS/BLUE SHIELD | Source: Ambulatory Visit | Attending: Obstetrics & Gynecology | Admitting: Obstetrics & Gynecology

## 2017-10-15 DIAGNOSIS — Z1231 Encounter for screening mammogram for malignant neoplasm of breast: Secondary | ICD-10-CM | POA: Diagnosis not present

## 2017-10-16 ENCOUNTER — Other Ambulatory Visit: Payer: Self-pay | Admitting: Obstetrics & Gynecology

## 2017-10-16 DIAGNOSIS — R928 Other abnormal and inconclusive findings on diagnostic imaging of breast: Secondary | ICD-10-CM

## 2017-10-17 DIAGNOSIS — M25661 Stiffness of right knee, not elsewhere classified: Secondary | ICD-10-CM | POA: Diagnosis not present

## 2017-10-20 DIAGNOSIS — M25661 Stiffness of right knee, not elsewhere classified: Secondary | ICD-10-CM | POA: Diagnosis not present

## 2017-10-22 ENCOUNTER — Other Ambulatory Visit: Payer: Self-pay | Admitting: Obstetrics & Gynecology

## 2017-10-22 ENCOUNTER — Ambulatory Visit
Admission: RE | Admit: 2017-10-22 | Discharge: 2017-10-22 | Disposition: A | Discharge status: Home / Self Care | Payer: BLUE CROSS/BLUE SHIELD | Source: Ambulatory Visit | Attending: Obstetrics & Gynecology | Admitting: Obstetrics & Gynecology

## 2017-10-22 DIAGNOSIS — R921 Mammographic calcification found on diagnostic imaging of breast: Secondary | ICD-10-CM

## 2017-10-22 DIAGNOSIS — R928 Other abnormal and inconclusive findings on diagnostic imaging of breast: Secondary | ICD-10-CM

## 2017-10-23 DIAGNOSIS — M25661 Stiffness of right knee, not elsewhere classified: Secondary | ICD-10-CM | POA: Diagnosis not present

## 2017-10-30 DIAGNOSIS — M25661 Stiffness of right knee, not elsewhere classified: Secondary | ICD-10-CM | POA: Diagnosis not present

## 2017-11-04 DIAGNOSIS — M25661 Stiffness of right knee, not elsewhere classified: Secondary | ICD-10-CM | POA: Diagnosis not present

## 2017-11-06 ENCOUNTER — Other Ambulatory Visit: Payer: Self-pay | Admitting: Obstetrics & Gynecology

## 2017-11-06 ENCOUNTER — Other Ambulatory Visit: Payer: Self-pay | Admitting: Internal Medicine

## 2017-11-06 DIAGNOSIS — M25661 Stiffness of right knee, not elsewhere classified: Secondary | ICD-10-CM | POA: Diagnosis not present

## 2017-11-07 NOTE — Telephone Encounter (Signed)
Medication refill request: Vitamin D  Last AEX:  02/18/17 SM Next AEX: 05/07/18  Last MMG (if hormonal medication request): 10/22/17 BIRADS 3 Probably benign Refill authorized: 08/11/17 #12 w/0 refills; today please advise

## 2017-12-07 ENCOUNTER — Other Ambulatory Visit: Payer: Self-pay | Admitting: Endocrinology

## 2018-01-04 DIAGNOSIS — J209 Acute bronchitis, unspecified: Secondary | ICD-10-CM | POA: Diagnosis not present

## 2018-01-09 ENCOUNTER — Other Ambulatory Visit: Payer: Self-pay | Admitting: Endocrinology

## 2018-02-04 DIAGNOSIS — M150 Primary generalized (osteo)arthritis: Secondary | ICD-10-CM | POA: Diagnosis not present

## 2018-02-04 DIAGNOSIS — M2550 Pain in unspecified joint: Secondary | ICD-10-CM | POA: Diagnosis not present

## 2018-02-12 ENCOUNTER — Other Ambulatory Visit: Payer: Self-pay | Admitting: Endocrinology

## 2018-02-26 ENCOUNTER — Other Ambulatory Visit: Payer: Self-pay | Admitting: Obstetrics & Gynecology

## 2018-02-26 NOTE — Telephone Encounter (Signed)
Medication refill request: Zoloft Last AEX:  02/18/17 SM Next AEX: 05/07/18 Last MMG (if hormonal medication request): 10/15/17 BILATERAL MMG BIRADS 0:Incomplete; 10/22/17 MM Diagnostic Right -- BIRADS 3 Probably Benign/density b Refill authorized: 02/18/17 #30 w/12 refills; today please advise

## 2018-03-15 ENCOUNTER — Other Ambulatory Visit: Payer: Self-pay | Admitting: Endocrinology

## 2018-04-10 ENCOUNTER — Other Ambulatory Visit: Payer: Self-pay | Admitting: Endocrinology

## 2018-04-29 ENCOUNTER — Other Ambulatory Visit: Payer: Self-pay | Admitting: Obstetrics & Gynecology

## 2018-04-29 ENCOUNTER — Ambulatory Visit
Admission: RE | Admit: 2018-04-29 | Discharge: 2018-04-29 | Disposition: A | Discharge status: Home / Self Care | Payer: BLUE CROSS/BLUE SHIELD | Source: Ambulatory Visit | Attending: Obstetrics & Gynecology | Admitting: Obstetrics & Gynecology

## 2018-04-29 DIAGNOSIS — R921 Mammographic calcification found on diagnostic imaging of breast: Secondary | ICD-10-CM

## 2018-05-07 ENCOUNTER — Ambulatory Visit: Payer: BLUE CROSS/BLUE SHIELD | Admitting: Obstetrics & Gynecology

## 2018-05-07 ENCOUNTER — Encounter

## 2018-05-07 ENCOUNTER — Other Ambulatory Visit: Payer: Self-pay

## 2018-05-07 ENCOUNTER — Encounter: Payer: Self-pay | Admitting: Obstetrics & Gynecology

## 2018-05-07 VITALS — BP 142/78 | HR 64 | Resp 18 | Ht 61.0 in | Wt 243.0 lb

## 2018-05-07 DIAGNOSIS — E039 Hypothyroidism, unspecified: Secondary | ICD-10-CM | POA: Diagnosis not present

## 2018-05-07 DIAGNOSIS — Z01419 Encounter for gynecological examination (general) (routine) without abnormal findings: Secondary | ICD-10-CM

## 2018-05-07 DIAGNOSIS — E7800 Pure hypercholesterolemia, unspecified: Secondary | ICD-10-CM | POA: Diagnosis not present

## 2018-05-07 DIAGNOSIS — E559 Vitamin D deficiency, unspecified: Secondary | ICD-10-CM | POA: Diagnosis not present

## 2018-05-07 DIAGNOSIS — Z0000 Encounter for general adult medical examination without abnormal findings: Secondary | ICD-10-CM | POA: Diagnosis not present

## 2018-05-07 MED ORDER — VITAMIN D (ERGOCALCIFEROL) 1.25 MG (50000 UNIT) PO CAPS: ORAL_CAPSULE | ORAL | 4 refills | Status: DC

## 2018-05-07 MED ORDER — LEVOTHYROXINE SODIUM 75 MCG PO TABS: ORAL_TABLET | ORAL | 4 refills | Status: DC

## 2018-05-07 MED ORDER — SERTRALINE HCL 50 MG PO TABS: 50.0000 mg | ORAL_TABLET | Freq: Every day | ORAL | 4 refills | Status: DC

## 2018-05-07 NOTE — Progress Notes (Signed)
57 y.o. G2P2 MarriedCaucasianF here for annual exam.  Doing well.  Had torn meniscus in November and Dr. Shelle Iron did her surgery.  MRI showed and old fracture in the knee.  Now on celebrex for knee pain.    Saw Dr. Everardo All last year.  Was released unless having new issues/concerns.   Denies vaginal bleeding.    Granddaughter is going to be 4 in August.  She is so much fun right now.  Patient's last menstrual period was 11/18/2005.          Sexually active: Yes.    The current method of family planning is status post hysterectomy.    Exercising: No.   Smoker:  no  Health Maintenance: Pap:  02/18/17 Neg  History of abnormal Pap:  no MMG:  04/29/18 diagnostic right BIRADS3:Probably benign. Has appt 10/30/18 Colonoscopy:  2012 f/u 10 years  BMD:   Never TDaP:  2010 Pneumonia vaccine(s):  n/a Shingrix:   No Hep C testing: 10/19/15 neg  Screening Labs: Here - fasting    reports that she has never smoked. She has never used smokeless tobacco. She reports that she does not drink alcohol or use drugs.  Past Medical History:  Diagnosis Date  . Anxiety and depression   . Carpal tunnel syndrome, bilateral   . History of blood transfusion    during T&A  . HSV infection    HSV I/II  . Hypothyroid   . Perirectal abscess 4/10   h/o  . Rheumatoid arthritis ([facility]CC)    Dr. Nickola Major  . Torn meniscus    right knee - 09/2017    Past Surgical History:  Procedure Laterality Date  . CARPAL TUNNEL RELEASE  12/15/09   and tendon clean out  . CESAREAN SECTION  1989, 1995  . INCISION AND DRAINAGE PERIRECTAL ABSCESS  4/10  . KNEE SURGERY Right 09/2017  . TONSILLECTOMY AND ADENOIDECTOMY    . TOTAL ABDOMINAL HYSTERECTOMY  9/07  . TUBAL LIGATION  1995    Current Outpatient Medications  Medication Sig Dispense Refill  . buPROPion (WELLBUTRIN XL) 150 MG 24 hr tablet Take 1 tablet (150 mg total) by mouth daily. 30 tablet 2  . celecoxib (CELEBREX) 200 MG capsule Take 1 capsule by mouth 2 (two) times  daily.    Marland Kitchen levothyroxine (SYNTHROID, LEVOTHROID) 75 MCG tablet TAKE 1 TABLET(75 MCG) BY MOUTH DAILY 30 tablet 0  . montelukast (SINGULAIR) 10 MG tablet Take 10 mg by mouth daily.    . sertraline (ZOLOFT) 50 MG tablet TAKE 1 TABLET(50 MG) BY MOUTH DAILY 30 tablet 3  . Vitamin D, Ergocalciferol, (DRISDOL) 50000 units CAPS capsule TAKE 1 CAPSULE BY MOUTH EVERY 7 DAYS 12 capsule 0   No current facility-administered medications for this visit.     Family History  Problem Relation Age of Onset  . Diabetes Maternal Grandfather   . Cancer Father        colon and bone  . Rheumatic fever Mother   . Bell's palsy Mother   . Heart disease Mother   . Heart attack Brother        over 41 years old  . Thyroid disease Neg Hx     Review of Systems  HENT:       Craving sweets Heat/cold intolerance   Genitourinary: Positive for frequency.       Loss of urine with sneeze or cough  Night urination   Musculoskeletal:       Muscle weakness   All  other systems reviewed and are negative.   Exam:   BP (!) 142/78 (BP Location: Left Arm, Patient Position: Sitting, Cuff Size: Large)   Pulse 64   Resp 18   Ht 5\' 1"  (1.549 m)   Wt 243 lb (110.2 kg)   LMP 11/18/2005   BMI 45.91 kg/m    Height: 5\' 1"  (154.9 cm)  Ht Readings from Last 3 Encounters:  05/07/18 5\' 1"  (1.549 m)  08/08/17 5\' 2"  (1.575 m)  06/30/17 5\' 1"  (1.549 m)    General appearance: alert, cooperative and appears stated age Head: Normocephalic, without obvious abnormality, atraumatic Neck: no adenopathy, supple, symmetrical, trachea midline and thyroid normal to inspection and palpation Lungs: clear to auscultation bilaterally Breasts: normal appearance, no masses or tenderness Heart: regular rate and rhythm Abdomen: soft, non-tender; bowel sounds normal; no masses,  no organomegaly Extremities: extremities normal, atraumatic, no cyanosis or edema Skin: Skin color, texture, turgor normal. No rashes or lesions Lymph nodes:  Cervical, supraclavicular, and axillary nodes normal. No abnormal inguinal nodes palpated Neurologic: Grossly normal   Pelvic: External genitalia:  no lesions              Urethra:  normal appearing urethra with no masses, tenderness or lesions              Bartholins and Skenes: normal                 Vagina: normal appearing vagina with normal color and discharge, no lesions              Cervix: absent              Pap taken: No. Bimanual Exam:  Uterus:  uterus absent              Adnexa: no mass, fullness, tenderness               Rectovaginal: Confirms               Anus:  normal sphincter tone, no lesions  Chaperone was present for exam.  A:  Well Woman with normal exam PMP, no HRT H/o TAH Depression Hypothyroidism H/o RA, does not desire to see rheumatologist H/O perirectal abscess 4/10 Obesity  P:   Mammogram guidelines reviewed pap smear not indicated Zoloft 50mg  daily.  #90/4RF Vit D 50K weekly #12/4RF Synthroid 08/10/17 daily.  #90/4RF Lab work obtained today:  TSH and free t$, Lipids, CMP, CBC Return annually or prn

## 2018-05-08 ENCOUNTER — Telehealth: Payer: Self-pay | Admitting: *Deleted

## 2018-05-08 LAB — T4, FREE: FREE T4: 0.84 ng/dL (ref 0.82–1.77)

## 2018-05-08 LAB — COMPREHENSIVE METABOLIC PANEL
ALT: 13 IU/L (ref 0–32)
AST: 18 IU/L (ref 0–40)
Albumin/Globulin Ratio: 1.4 (ref 1.2–2.2)
Albumin: 4.2 g/dL (ref 3.5–5.5)
Alkaline Phosphatase: 64 IU/L (ref 39–117)
BUN/Creatinine Ratio: 19 (ref 9–23)
BUN: 13 mg/dL (ref 6–24)
Bilirubin Total: 0.4 mg/dL (ref 0.0–1.2)
CALCIUM: 9.1 mg/dL (ref 8.7–10.2)
CO2: 23 mmol/L (ref 20–29)
CREATININE: 0.7 mg/dL (ref 0.57–1.00)
Chloride: 104 mmol/L (ref 96–106)
GFR calc Af Amer: 111 mL/min/{1.73_m2} (ref >59)
GFR, EST NON AFRICAN AMERICAN: 96 mL/min/{1.73_m2} (ref >59)
GLOBULIN, TOTAL: 2.9 g/dL (ref 1.5–4.5)
Glucose: 97 mg/dL (ref 65–99)
Potassium: 4.5 mmol/L (ref 3.5–5.2)
SODIUM: 144 mmol/L (ref 134–144)
TOTAL PROTEIN: 7.1 g/dL (ref 6.0–8.5)

## 2018-05-08 LAB — LIPID PANEL
CHOL/HDL RATIO: 4.5 ratio — AB (ref 0.0–4.4)
Cholesterol, Total: 280 mg/dL — ABNORMAL HIGH (ref 100–199)
HDL: 62 mg/dL (ref >39)
LDL CALC: 189 mg/dL — AB (ref 0–99)
TRIGLYCERIDES: 146 mg/dL (ref 0–149)
VLDL Cholesterol Cal: 29 mg/dL (ref 5–40)

## 2018-05-08 LAB — CBC
HEMATOCRIT: 39.3 % (ref 34.0–46.6)
Hemoglobin: 12.9 g/dL (ref 11.1–15.9)
MCH: 29.4 pg (ref 26.6–33.0)
MCHC: 32.8 g/dL (ref 31.5–35.7)
MCV: 90 fL (ref 79–97)
Platelets: 255 10*3/uL (ref 150–450)
RBC: 4.39 x10E6/uL (ref 3.77–5.28)
RDW: 14.9 % (ref 12.3–15.4)
WBC: 5.7 10*3/uL (ref 3.4–10.8)

## 2018-05-08 LAB — VITAMIN D 25 HYDROXY (VIT D DEFICIENCY, FRACTURES): Vit D, 25-Hydroxy: 14.8 ng/mL — ABNORMAL LOW (ref 30.0–100.0)

## 2018-05-08 LAB — TSH: TSH: 15.27 u[IU]/mL — ABNORMAL HIGH (ref 0.450–4.500)

## 2018-05-08 NOTE — Telephone Encounter (Signed)
LM for pt to call back.

## 2018-05-08 NOTE — Telephone Encounter (Signed)
-----   Message from Jerene Bears, MD sent at 05/08/2018 12:17 PM EDT ----- Please let pt know her TSH is elevated this year showing she is not getting enough thyroid medication.  Does she want to see Dr. Everardo All again or someone else?   Her CBC was normal.   Her CMP was normal.  Her cholesterol was further elevated this year.  Her LDLs are 189.  She absolutely needs treatment.  She needs to see her PCP for this.  Can we fax records for her?    Lastly, her Vit D was 15.  Is she taking the Vit D prescription regularly or does she miss doses?

## 2018-05-10 ENCOUNTER — Other Ambulatory Visit: Payer: Self-pay | Admitting: Endocrinology

## 2018-05-10 NOTE — Telephone Encounter (Signed)
Please refill x 1 Ov is due  

## 2018-05-11 ENCOUNTER — Other Ambulatory Visit: Payer: Self-pay

## 2018-05-11 MED ORDER — LEVOTHYROXINE SODIUM 75 MCG PO TABS: ORAL_TABLET | ORAL | 0 refills | Status: DC

## 2018-05-11 NOTE — Telephone Encounter (Signed)
Second attempt to contact patient. LMTCB 

## 2018-05-14 NOTE — Telephone Encounter (Signed)
Unable to reach letter sent to patient

## 2018-05-15 ENCOUNTER — Ambulatory Visit: Payer: BLUE CROSS/BLUE SHIELD | Admitting: Endocrinology

## 2018-05-27 ENCOUNTER — Telehealth: Payer: Self-pay | Admitting: Obstetrics & Gynecology

## 2018-05-27 ENCOUNTER — Telehealth: Payer: Self-pay | Admitting: Endocrinology

## 2018-05-27 NOTE — Telephone Encounter (Signed)
Spoke with patient, advised of results and recommendations per Dr. Hyacinth Meeker for labs dated 05/07/18.   1. Patient request copy of TSH labs be faxed to Dr. Everardo All, patient will call directly to schedule f/u. Copy of labs faxed via Epic.   2. Patient request copy of labs be faxed to PCP/ Dr. Tresa Endo. Patient will call directly to schedule f/u. Copy of labs faxed via Epic.   3. Patient states she had been off of the prescription Vit D 2 mo prior to appt, has since restarted Vit D RX. Advised will review f/u with Dr. Hyacinth Meeker and return call, patient agreeable.   Dr. Hyacinth Meeker -please advise on Vit D f/u?   Cc: Gara Kroner

## 2018-05-27 NOTE — Telephone Encounter (Signed)
Patient returning someone's call for lab results. No open phone note. Patient states she reviewed labs on mychart and believes someone was concerned about her cholesterol.

## 2018-05-27 NOTE — Telephone Encounter (Signed)
Left detailed message, ok per dpr. Advised as seen below per Dr. Miller. Return call to office if any additional questions.   Encounter closed.  

## 2018-05-27 NOTE — Telephone Encounter (Signed)
Patient had labs with Dr. Leda Quail and TSH was high. Dr. Rondel Baton office is forwarding lab results over to Dr. Everardo All. Does patient need a medication adjustment based on latest labs. Please call patient at ph# [phone], ext [phone]

## 2018-05-27 NOTE — Telephone Encounter (Signed)
Ok to just recheck next year if she goes ahead and restarts the Vit D.  Thanks.

## 2018-05-28 NOTE — Telephone Encounter (Signed)
If you see this lab work will you send Dr. Everardo All the result since I am in [place] the remainder of the week? Thanks!

## 2018-05-28 NOTE — Telephone Encounter (Signed)
I have not seen these labs yet but I will look out for them

## 2018-05-28 NOTE — Telephone Encounter (Signed)
Please message me with the TSH result, when it is received. Please come back for a follow-up appointment in 6 weeks

## 2018-06-02 MED ORDER — LEVOTHYROXINE SODIUM 100 MCG PO TABS: 100.0000 ug | ORAL_TABLET | Freq: Every day | ORAL | 1 refills | Status: DC

## 2018-06-02 NOTE — Telephone Encounter (Signed)
Results are in pt chart, please advise once you have a chance to review results

## 2018-06-02 NOTE — Telephone Encounter (Signed)
appt scheduled

## 2018-06-02 NOTE — Telephone Encounter (Signed)
I have sent a prescription to your pharmacy, to increase the thyroid pill.  Please come back for a follow-up appointment in 6 weeks

## 2018-06-09 DIAGNOSIS — H6983 Other specified disorders of Eustachian tube, bilateral: Secondary | ICD-10-CM | POA: Diagnosis not present

## 2018-06-09 DIAGNOSIS — H66003 Acute suppurative otitis media without spontaneous rupture of ear drum, bilateral: Secondary | ICD-10-CM | POA: Diagnosis not present

## 2018-06-15 ENCOUNTER — Encounter: Payer: Self-pay | Admitting: Obstetrics & Gynecology

## 2018-06-15 DIAGNOSIS — E559 Vitamin D deficiency, unspecified: Secondary | ICD-10-CM | POA: Insufficient documentation

## 2018-07-15 ENCOUNTER — Ambulatory Visit: Payer: BLUE CROSS/BLUE SHIELD | Admitting: Endocrinology

## 2018-07-15 ENCOUNTER — Encounter: Payer: Self-pay | Admitting: Endocrinology

## 2018-07-15 VITALS — BP 176/120 | HR 86 | Ht 61.0 in | Wt 245.0 lb

## 2018-07-15 DIAGNOSIS — E038 Other specified hypothyroidism: Secondary | ICD-10-CM | POA: Diagnosis not present

## 2018-07-15 DIAGNOSIS — E063 Autoimmune thyroiditis: Secondary | ICD-10-CM

## 2018-07-15 NOTE — Progress Notes (Signed)
Subjective:    Patient ID: Carla Lawrence, female    DOB: 09/07/1961, 57 y.o.   MRN: [id number]  HPI Pt returns for f/u of hypothyroidism (dx'ed 1973; she has been on prescribed thyroid hormone therapy since then; she has never had thyroid imaging).  Since synthroid was increased, she feels slightly more energetic in general.   Past Medical History:  Diagnosis Date  . Anxiety and depression   . Carpal tunnel syndrome, bilateral   . History of blood transfusion    during T&A  . HSV infection    HSV I/II  . Hypothyroid   . Perirectal abscess 4/10   h/o  . Rheumatoid arthritis ([facility]CC)    Dr. Nickola Major  . Torn meniscus    right knee - 09/2017    Past Surgical History:  Procedure Laterality Date  . CARPAL TUNNEL RELEASE  12/15/09   and tendon clean out  . CESAREAN SECTION  1989, 1995  . INCISION AND DRAINAGE PERIRECTAL ABSCESS  4/10  . KNEE SURGERY Right 09/2017  . TONSILLECTOMY AND ADENOIDECTOMY    . TOTAL ABDOMINAL HYSTERECTOMY  9/07  . TUBAL LIGATION  1995    Social History   Socioeconomic History  . Marital status: Married    Spouse name: Not on file  . Number of children: Not on file  . Years of education: Not on file  . Highest education level: Not on file  Occupational History  . Not on file  Social Needs  . Financial resource strain: Not on file  . Food insecurity:    Worry: Not on file    Inability: Not on file  . Transportation needs:    Medical: Not on file    Non-medical: Not on file  Tobacco Use  . Smoking status: Never Smoker  . Smokeless tobacco: Never Used  Substance and Sexual Activity  . Alcohol use: No  . Drug use: No  . Sexual activity: Yes    Partners: Male    Birth control/protection: Surgical, Spermicide    Comment: TAH  Lifestyle  . Physical activity:    Days per week: Not on file    Minutes per session: Not on file  . Stress: Not on file  Relationships  . Social connections:    Talks on phone: Not on file    Gets together: Not on  file    Attends religious service: Not on file    Active member of club or organization: Not on file    Attends meetings of clubs or organizations: Not on file    Relationship status: Not on file  . Intimate partner violence:    Fear of current or ex partner: Not on file    Emotionally abused: Not on file    Physically abused: Not on file    Forced sexual activity: Not on file  Other Topics Concern  . Not on file  Social History Narrative  . Not on file    Current Outpatient Medications on File Prior to Visit  Medication Sig Dispense Refill  . celecoxib (CELEBREX) 200 MG capsule Take 1 capsule by mouth 2 (two) times daily.    . montelukast (SINGULAIR) 10 MG tablet Take 10 mg by mouth daily.    . sertraline (ZOLOFT) 50 MG tablet Take 1 tablet (50 mg total) by mouth daily. 90 tablet 4  . Vitamin D, Ergocalciferol, (DRISDOL) 50000 units CAPS capsule TAKE 1 CAPSULE BY MOUTH EVERY 7 DAYS 12 capsule 4   No current  facility-administered medications on file prior to visit.     Allergies  Allergen Reactions  . Sulfa Antibiotics Rash    Family History  Problem Relation Age of Onset  . Diabetes Maternal Grandfather   . Cancer Father        colon and bone  . Rheumatic fever Mother   . Bell's palsy Mother   . Heart disease Mother   . Heart attack Brother        over 50 years old  . Thyroid disease Neg Hx     BP (!) 176/120 (BP Location: Right Arm, Patient Position: Sitting, Cuff Size: Normal)   Pulse 86   Ht 5\' 1"  (1.549 m)   Wt 245 lb (111.1 kg)   LMP 11/18/2005   SpO2 96%   BMI 46.29 kg/m    Review of Systems She has slight swelling of the legs.      Objective:   Physical Exam VITAL SIGNS:  See vs page.   GENERAL: no distress.  Eyes: slight bilat proptosis.   NECK: There is no palpable thyroid enlargement.  No thyroid nodule is palpable.  No palpable lymphadenopathy at the anterior neck.   Lab Results  Component Value Date   TSH 5.88 (H) 07/15/2018        Assessment & Plan:  Hypothyroidism: she needs increased rx.  I have sent a prescription to your pharmacy Your blood pressure is high today.  Please see your primary care provider soon, to have it rechecked: I advised pt to recheck with PCP soon

## 2018-07-15 NOTE — Patient Instructions (Addendum)
Your blood pressure is high today.  Please see your primary care provider soon, to have it rechecked blood tests are requested for you today.  We'll let you know about the results.  Please return in 1 year.          Hypothyroidism Hypothyroidism is a disorder of the thyroid. The thyroid is a large gland that is located in the lower front of the neck. The thyroid releases hormones that control how the body works. With hypothyroidism, the thyroid does not make enough of these hormones. What are the causes? Causes of hypothyroidism may include:  Viral infections.  Pregnancy.  Your own defense system (immune system) attacking your thyroid.  Certain medicines.  Birth defects.  Past radiation treatments to your head or neck.  Past treatment with radioactive iodine.  Past surgical removal of part or all of your thyroid.  Problems with the gland that is located in the center of your brain (pituitary).  What are the signs or symptoms? Signs and symptoms of hypothyroidism may include:  Feeling as though you have no energy (lethargy).  Inability to tolerate cold.  Weight gain that is not explained by a change in diet or exercise habits.  Dry skin.  Coarse hair.  Menstrual irregularity.  Slowing of thought processes.  Constipation.  Sadness or depression.  How is this diagnosed? Your health care provider may diagnose hypothyroidism with blood tests and ultrasound tests. How is this treated? Hypothyroidism is treated with medicine that replaces the hormones that your body does not make. After you begin treatment, it may take several weeks for symptoms to go away. Follow these instructions at home:  Take medicines only as directed by your health care provider.  If you start taking any new medicines, tell your health care provider.  Keep all follow-up visits as directed by your health care provider. This is important. As your condition improves, your dosage needs may  change. You will need to have blood tests regularly so that your health care provider can watch your condition. Contact a health care provider if:  Your symptoms do not get better with treatment.  You are taking thyroid replacement medicine and: ? You sweat excessively. ? You have tremors. ? You feel anxious. ? You lose weight rapidly. ? You cannot tolerate heat. ? You have emotional swings. ? You have diarrhea. ? You feel weak. Get help right away if:  You develop chest pain.  You develop an irregular heartbeat.  You develop a rapid heartbeat. This information is not intended to replace advice given to you by your health care provider. Make sure you discuss any questions you have with your health care provider. Document Released: 11/04/2005 Document Revised: 04/11/2016 Document Reviewed: 03/22/2014 Elsevier Interactive Patient Education  2018 [employer].

## 2018-07-16 LAB — TSH: TSH: 5.88 u[IU]/mL — ABNORMAL HIGH (ref 0.35–4.50)

## 2018-07-16 LAB — T4, FREE: Free T4: 0.64 ng/dL (ref 0.60–1.60)

## 2018-07-16 MED ORDER — LEVOTHYROXINE SODIUM 112 MCG PO TABS: 112.0000 ug | ORAL_TABLET | Freq: Every day | ORAL | 3 refills | Status: DC

## 2018-08-08 IMAGING — MG 2D DIGITAL SCREENING BILATERAL MAMMOGRAM WITH CAD AND ADJUNCT TO
8 of 13 series · 8 of 29 positions shown · non-contrast
Comparison: Previous exam(s).

CLINICAL DATA: Screening.

EXAM:
2D DIGITAL SCREENING BILATERAL MAMMOGRAM WITH CAD AND ADJUNCT TOMO

[R MLO (1 of 2)]
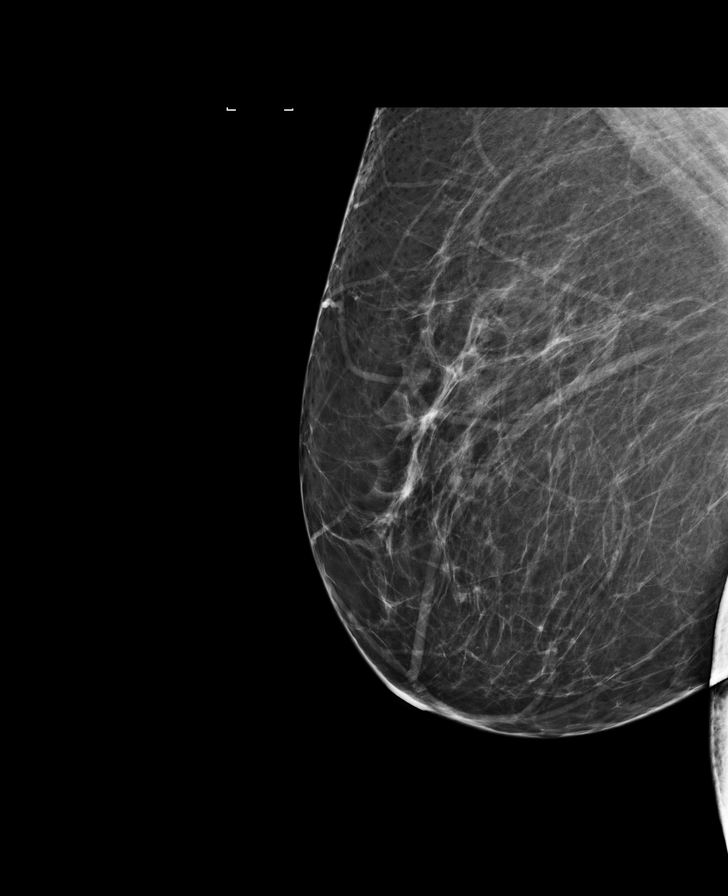

[L MLO]
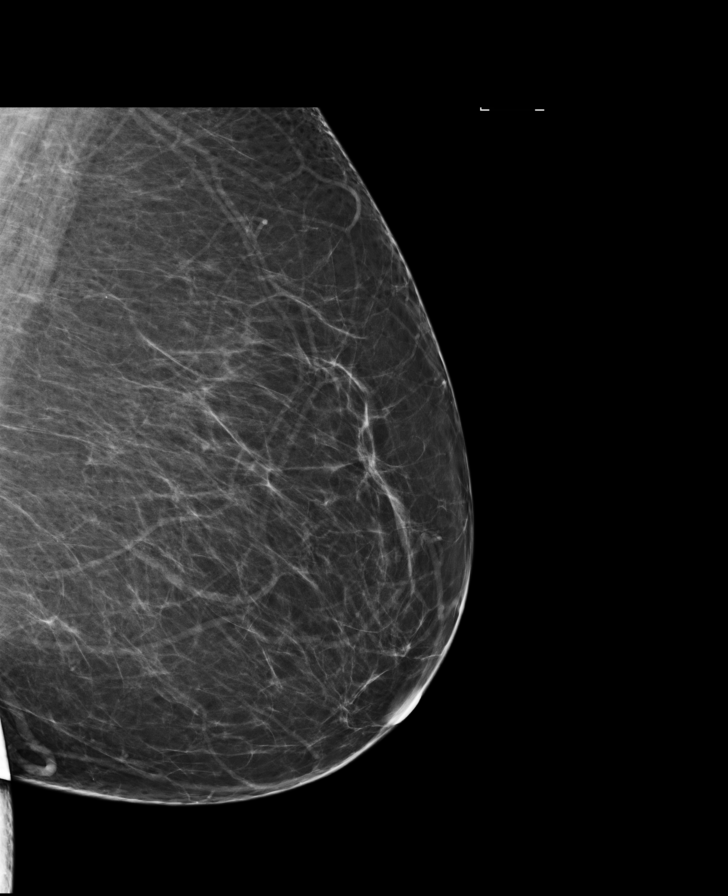

[L MLO synth-2D]
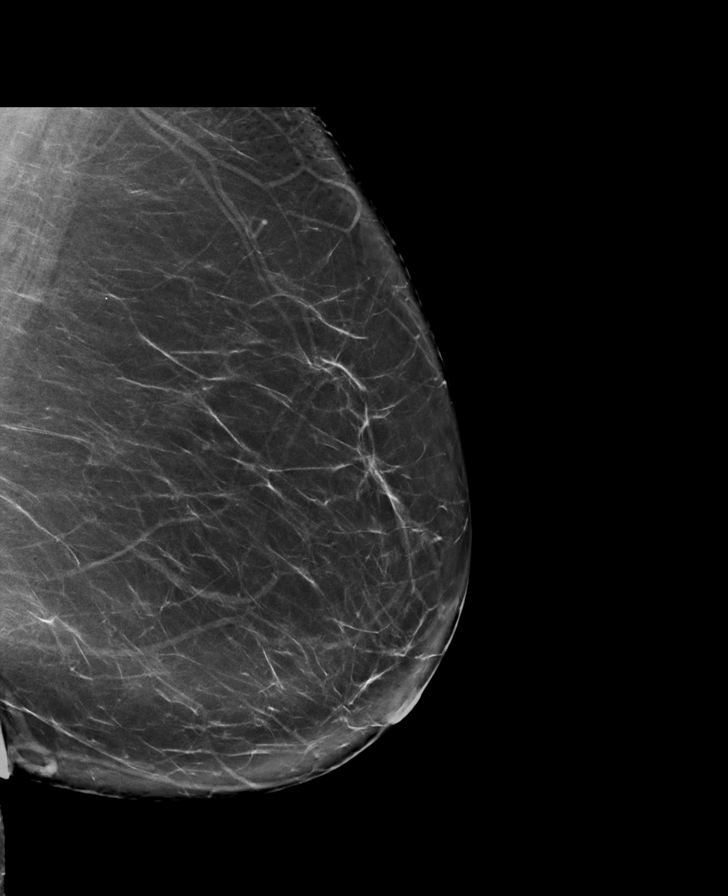

[L CC]
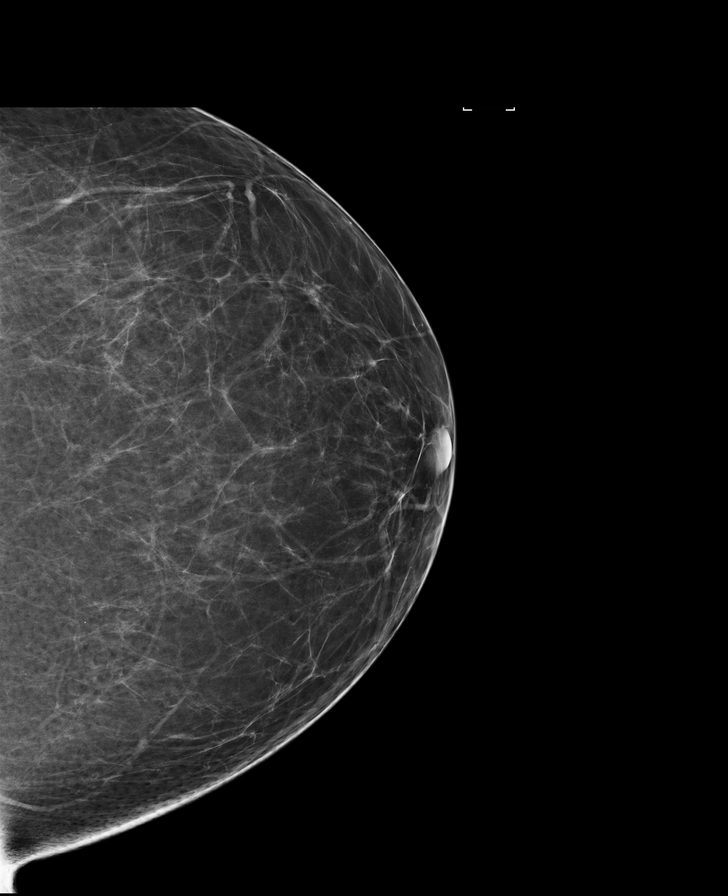

[L CC synth-2D]
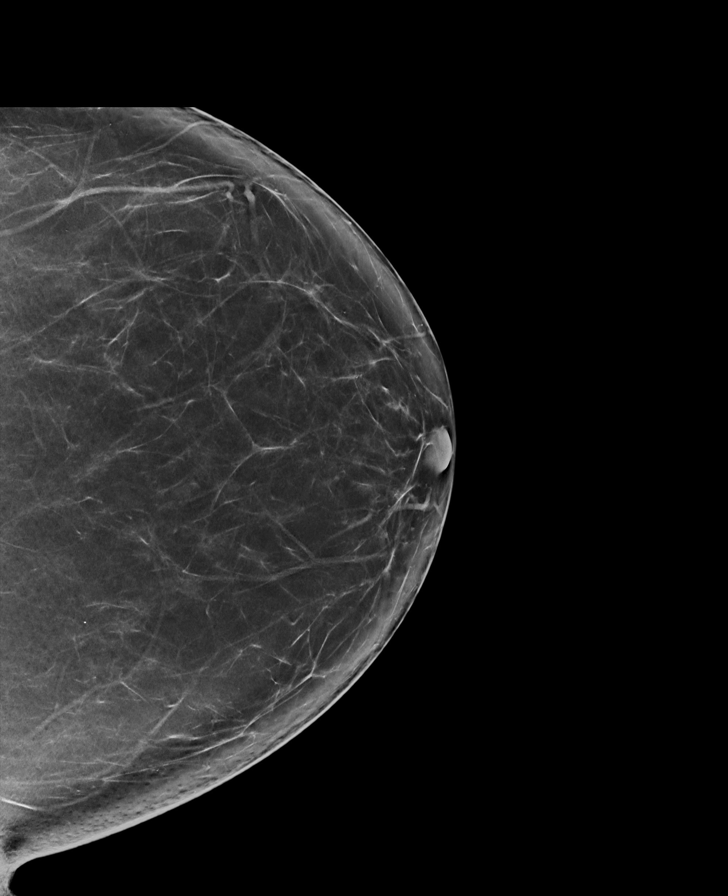

[R MLO (2 of 2)]
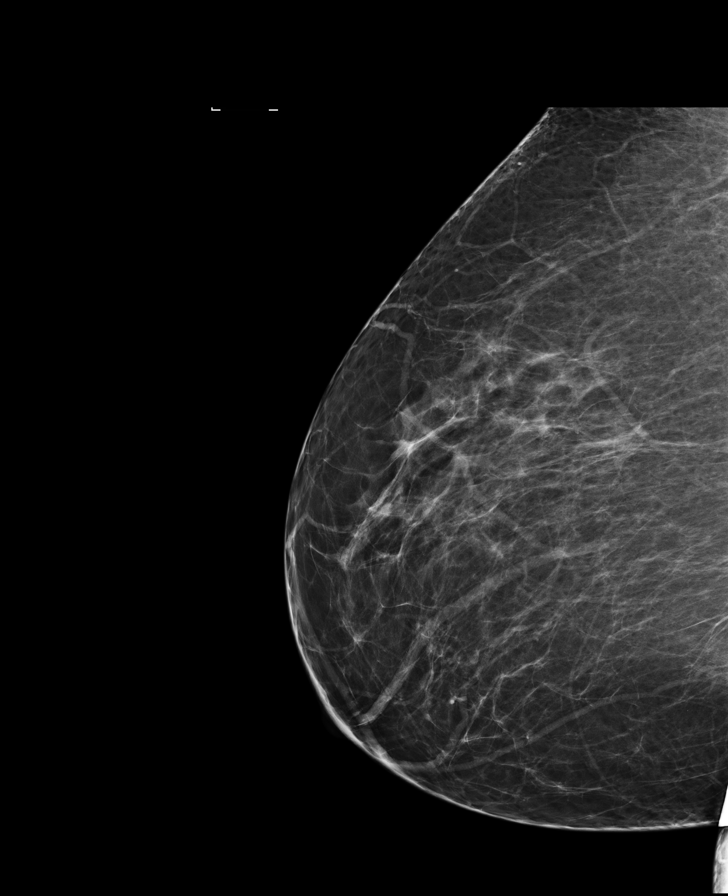

[R MLO synth-2D]
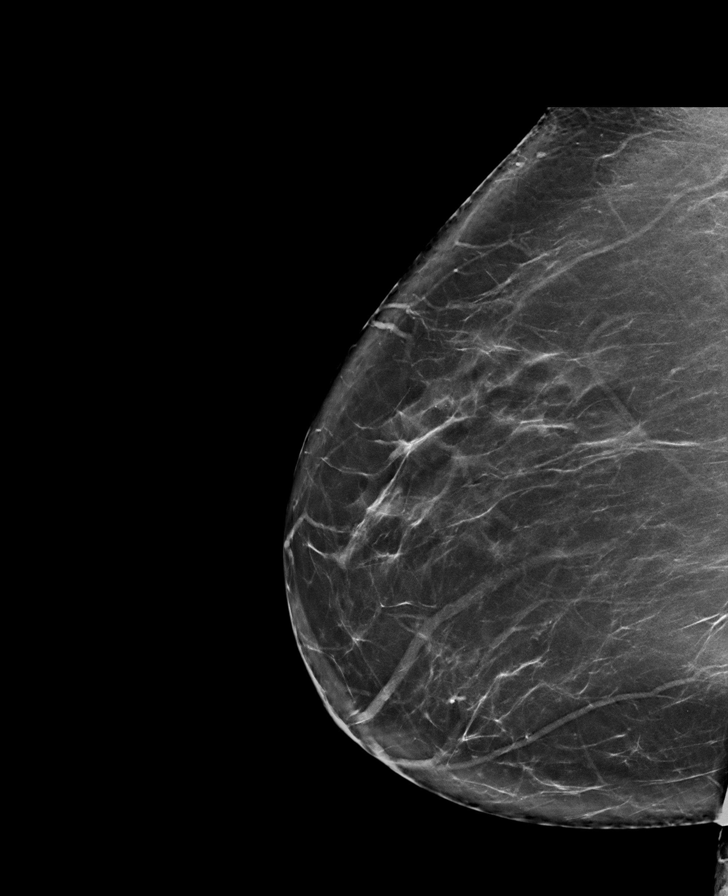

[R CC]
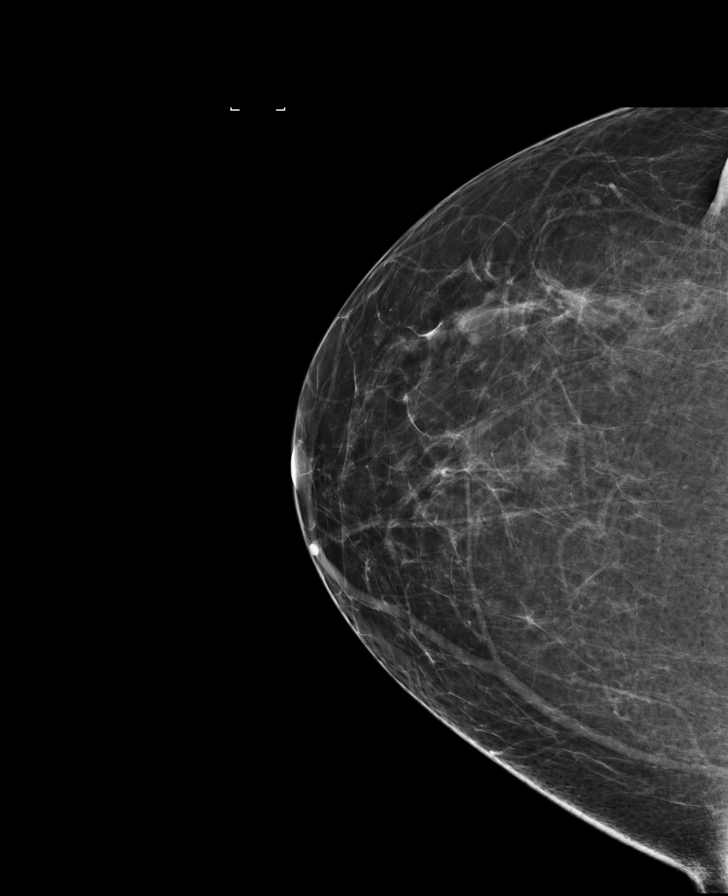

[8 of 29 positions shown; findings below may reference images not displayed]

ACR Breast Density Category b: There are scattered areas of
fibroglandular density.
FINDINGS: There are no findings suspicious for malignancy. Images were
processed with CAD.
IMPRESSION: No mammographic evidence of malignancy. A result letter of this
screening mammogram will be mailed directly to the patient.

RECOMMENDATION:
Screening mammogram in one year. (Code:97-6-RS4)

BI-RADS CATEGORY  1: Negative.

## 2018-10-24 DIAGNOSIS — R05 Cough: Secondary | ICD-10-CM | POA: Diagnosis not present

## 2018-10-24 DIAGNOSIS — J209 Acute bronchitis, unspecified: Secondary | ICD-10-CM | POA: Diagnosis not present

## 2018-12-28 DIAGNOSIS — H66001 Acute suppurative otitis media without spontaneous rupture of ear drum, right ear: Secondary | ICD-10-CM | POA: Diagnosis not present

## 2018-12-28 DIAGNOSIS — H6983 Other specified disorders of Eustachian tube, bilateral: Secondary | ICD-10-CM | POA: Diagnosis not present

## 2019-04-27 ENCOUNTER — Telehealth: Payer: Self-pay | Admitting: *Deleted

## 2019-04-27 DIAGNOSIS — H6983 Other specified disorders of Eustachian tube, bilateral: Secondary | ICD-10-CM | POA: Diagnosis not present

## 2019-04-27 DIAGNOSIS — H906 Mixed conductive and sensorineural hearing loss, bilateral: Secondary | ICD-10-CM | POA: Diagnosis not present

## 2019-04-27 NOTE — Telephone Encounter (Signed)
Left voicemail to call back to help schedule MMG appt or she can call [facility] directly and schedule appt since there is an order in epic.

## 2019-05-04 NOTE — Telephone Encounter (Signed)
Patient returned call. She will call [facility] and schedule mammogram.

## 2019-05-10 NOTE — Telephone Encounter (Signed)
Left voicemail to call back re: MMG appt.  Patient does not have appt scheduled yet.

## 2019-06-18 ENCOUNTER — Telehealth: Payer: Self-pay | Admitting: Obstetrics & Gynecology

## 2019-06-18 DIAGNOSIS — R921 Mammographic calcification found on diagnostic imaging of breast: Secondary | ICD-10-CM

## 2019-06-18 NOTE — Telephone Encounter (Signed)
Call to patient regarding follow-up breast imaging. Has not had 6 month follow-up of right breast and now also due for routine screening.  Offered to schedule appointment for her prior to upcoming annual in September with Dr Sabra Heck. Patient declines scheduling assistance but states she will call. Phone number to [place] provided.   Patient is scheduled for annual 08-17-19.  04 recall updated to 08-18-19.- review at annual exam.   Routing to Dr Sabra Heck for review

## 2019-06-18 NOTE — Telephone Encounter (Signed)
Routed to Sally, RN 

## 2019-06-18 NOTE — Telephone Encounter (Signed)
04 recall: Overdue for 6 mo f/u of right breast calcification noted on 04/29/18 Right breast Dx MMG. Patient is also due for screening, last screening 10/15/17.    Call placed to patient. Denies any new breast concerns/problems. Advised will place order for bilateral Dx MMG and right breast US, if needed. Patient request to contact [facility] directly and schedule. Patient verbalizes understanding.   Routing to provider for final review. Patient is agreeable to disposition. Will close encounter.

## 2019-06-18 NOTE — Telephone Encounter (Signed)
Patient is requesting an order be sent to [facility].for a diagnostic mammogram.

## 2019-07-07 ENCOUNTER — Ambulatory Visit: Payer: BLUE CROSS/BLUE SHIELD

## 2019-07-07 ENCOUNTER — Ambulatory Visit
Admission: RE | Admit: 2019-07-07 | Discharge: 2019-07-07 | Disposition: A | Discharge status: Home / Self Care | Payer: BC Managed Care – PPO | Source: Ambulatory Visit | Attending: Obstetrics & Gynecology | Admitting: Obstetrics & Gynecology

## 2019-07-07 ENCOUNTER — Other Ambulatory Visit: Payer: Self-pay

## 2019-07-07 DIAGNOSIS — R921 Mammographic calcification found on diagnostic imaging of breast: Secondary | ICD-10-CM

## 2019-07-14 ENCOUNTER — Other Ambulatory Visit: Payer: Self-pay | Admitting: Endocrinology

## 2019-07-19 ENCOUNTER — Other Ambulatory Visit: Payer: Self-pay

## 2019-07-19 ENCOUNTER — Encounter: Payer: Self-pay | Admitting: Endocrinology

## 2019-07-19 ENCOUNTER — Ambulatory Visit: Payer: BLUE CROSS/BLUE SHIELD | Admitting: Endocrinology

## 2019-07-19 VITALS — BP 144/78 | HR 91 | Ht 61.0 in | Wt 251.2 lb

## 2019-07-19 DIAGNOSIS — E038 Other specified hypothyroidism: Secondary | ICD-10-CM | POA: Diagnosis not present

## 2019-07-19 DIAGNOSIS — E063 Autoimmune thyroiditis: Secondary | ICD-10-CM | POA: Diagnosis not present

## 2019-07-19 NOTE — Progress Notes (Signed)
Subjective:    Patient ID: Carla Lawrence, female    DOB: July 23, 1961, 58 y.o.   MRN: [id number]  HPI Pt returns for f/u of hypothyroidism (dx'ed 1973; she has been on prescribed thyroid hormone therapy since then; she has never had thyroid imaging).  pt states she feels well in general.   Past Medical History:  Diagnosis Date  . Anxiety and depression   . Carpal tunnel syndrome, bilateral   . History of blood transfusion    during T&A  . HSV infection    HSV I/II  . Hypothyroid   . Perirectal abscess 4/10   h/o  . Rheumatoid arthritis ([place])    Dr. Trudie Reed  . Torn meniscus    right knee - 09/2017    Past Surgical History:  Procedure Laterality Date  . CARPAL TUNNEL RELEASE  12/15/09   and tendon clean out  . [place]  . INCISION AND DRAINAGE PERIRECTAL ABSCESS  4/10  . KNEE SURGERY Right 09/2017  . TONSILLECTOMY AND ADENOIDECTOMY    . TOTAL ABDOMINAL HYSTERECTOMY  9/07  . TUBAL LIGATION  1995    Social History   Socioeconomic History  . Marital status: Married    Spouse name: Not on file  . Number of children: Not on file  . Years of education: Not on file  . Highest education level: Not on file  Occupational History  . Not on file  Social Needs  . Financial resource strain: Not on file  . Food insecurity    Worry: Not on file    Inability: Not on file  . Transportation needs    Medical: Not on file    Non-medical: Not on file  Tobacco Use  . Smoking status: Never Smoker  . Smokeless tobacco: Never Used  Substance and Sexual Activity  . Alcohol use: No  . Drug use: No  . Sexual activity: Yes    Partners: Male    Birth control/protection: Surgical, Spermicide    Comment: TAH  Lifestyle  . Physical activity    Days per week: Not on file    Minutes per session: Not on file  . Stress: Not on file  Relationships  . Social Herbalist on phone: Not on file    Gets together: Not on file    Attends religious service: Not on  file    Active member of club or organization: Not on file    Attends meetings of clubs or organizations: Not on file    Relationship status: Not on file  . Intimate partner violence    Fear of current or ex partner: Not on file    Emotionally abused: Not on file    Physically abused: Not on file    Forced sexual activity: Not on file  Other Topics Concern  . Not on file  Social History Narrative  . Not on file    Current Outpatient Medications on File Prior to Visit  Medication Sig Dispense Refill  . celecoxib (CELEBREX) 200 MG capsule Take 1 capsule by mouth 2 (two) times daily.    . montelukast (SINGULAIR) 10 MG tablet Take 10 mg by mouth daily.    . sertraline (ZOLOFT) 50 MG tablet Take 1 tablet (50 mg total) by mouth daily. 90 tablet 4  . Vitamin D, Ergocalciferol, (DRISDOL) 50000 units CAPS capsule TAKE 1 CAPSULE BY MOUTH EVERY 7 DAYS 12 capsule 4   No current facility-administered medications on file  prior to visit.     Allergies  Allergen Reactions  . Sulfa Antibiotics Rash    Family History  Problem Relation Age of Onset  . Diabetes Maternal Grandfather   . Cancer Father        colon and bone  . Rheumatic fever Mother   . Bell's palsy Mother   . Heart disease Mother   . Heart attack Brother        over 50 years old  . Thyroid disease Neg Hx     BP (!) 144/78 (BP Location: Left Arm, Patient Position: Sitting, Cuff Size: Large)   Pulse 91   Ht 5\' 1"  (1.549 m)   Wt 251 lb 3.2 oz (113.9 kg)   LMP 11/18/2005   SpO2 94%   BMI 47.46 kg/m    Review of Systems Denies neck pain and swelling.      Objective:   Physical Exam VITAL SIGNS:  See vs page GENERAL: no distress NECK: There is no palpable thyroid enlargement.  No thyroid nodule is palpable.  No palpable lymphadenopathy at the anterior neck.   Lab Results  Component Value Date   TSH 1.73 07/19/2019      Assessment & Plan:  HTN: is noted today Hypothyroidism: well-replaced.  Please continue the  same medication.  Patient Instructions  Your blood pressure is high today.  Please see your primary care provider soon, to have it rechecked Blood tests are requested for you today.  We'll let you know about the results.  Please come back for a follow-up appointment in 1 year.

## 2019-07-19 NOTE — Patient Instructions (Signed)
Your blood pressure is high today.  Please see your primary care provider soon, to have it rechecked.   Blood tests are requested for you today.  We'll let you know about the results.   Please come back for a follow-up appointment in 1 year.   

## 2019-07-20 LAB — T4, FREE: Free T4: 0.8 ng/dL (ref 0.60–1.60)

## 2019-07-20 LAB — TSH: TSH: 1.73 u[IU]/mL (ref 0.35–4.50)

## 2019-07-22 MED ORDER — LEVOTHYROXINE SODIUM 112 MCG PO TABS: ORAL_TABLET | ORAL | 3 refills | Status: DC

## 2019-07-27 DIAGNOSIS — Z20828 Contact with and (suspected) exposure to other viral communicable diseases: Secondary | ICD-10-CM | POA: Diagnosis not present

## 2019-08-03 DIAGNOSIS — H6983 Other specified disorders of Eustachian tube, bilateral: Secondary | ICD-10-CM | POA: Diagnosis not present

## 2019-08-03 DIAGNOSIS — Z20828 Contact with and (suspected) exposure to other viral communicable diseases: Secondary | ICD-10-CM | POA: Diagnosis not present

## 2019-08-03 DIAGNOSIS — H66001 Acute suppurative otitis media without spontaneous rupture of ear drum, right ear: Secondary | ICD-10-CM | POA: Diagnosis not present

## 2019-08-04 ENCOUNTER — Other Ambulatory Visit: Payer: Self-pay | Admitting: Obstetrics & Gynecology

## 2019-08-04 NOTE — Telephone Encounter (Signed)
Medication refill request: Zoloft Last AEX:  05/07/2018 SM Next AEX: 08/17/2019 Last MMG (if hormonal medication request): 07/07/2019 BIRADS 3 Probably benign Density B Refill authorized: Pending #[phone] with no refills if appropriate. Please advise.

## 2019-08-09 DIAGNOSIS — Z20828 Contact with and (suspected) exposure to other viral communicable diseases: Secondary | ICD-10-CM | POA: Diagnosis not present

## 2019-08-16 DIAGNOSIS — Z20828 Contact with and (suspected) exposure to other viral communicable diseases: Secondary | ICD-10-CM | POA: Diagnosis not present

## 2019-08-17 ENCOUNTER — Other Ambulatory Visit: Payer: Self-pay

## 2019-08-17 ENCOUNTER — Encounter: Payer: Self-pay | Admitting: Obstetrics & Gynecology

## 2019-08-17 ENCOUNTER — Ambulatory Visit (INDEPENDENT_AMBULATORY_CARE_PROVIDER_SITE_OTHER): Payer: BC Managed Care – PPO | Admitting: Obstetrics & Gynecology

## 2019-08-17 VITALS — BP 138/80 | HR 80 | Temp 96.8°F | Ht 60.5 in | Wt 247.2 lb

## 2019-08-17 DIAGNOSIS — Z23 Encounter for immunization: Secondary | ICD-10-CM | POA: Diagnosis not present

## 2019-08-17 DIAGNOSIS — Z0000 Encounter for general adult medical examination without abnormal findings: Secondary | ICD-10-CM | POA: Diagnosis not present

## 2019-08-17 DIAGNOSIS — Z01419 Encounter for gynecological examination (general) (routine) without abnormal findings: Secondary | ICD-10-CM

## 2019-08-17 MED ORDER — SERTRALINE HCL 50 MG PO TABS: ORAL_TABLET | ORAL | 4 refills | Status: DC

## 2019-08-17 NOTE — Progress Notes (Signed)
58 y.o. G2P2 Married White or Caucasian female here for annual exam.  Doing well.  Has worked full time through all of Covid.  Has only had two part time employees that had Covid.  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  Denies vaginal bleeding.    Will have flu shot done at work.    Patient's last menstrual period was 11/18/2005.          Sexually active: Yes.    The current method of family planning is status post hysterectomy.    Exercising: Yes.    walking occ Smoker:  no  Health Maintenance: Pap:  02/18/17 Neg  History of abnormal Pap:  no MMG:  07/07/19 BIRADS3:probably Benign. F/u 1 year Diagnostic Bilateral  Colonoscopy:  2012 f/u 10 years  BMD:   Never TDaP:  2010 Pneumonia vaccine(s):  n/a Shingrix:   No Hep C testing: 10/19/15 neg Screening Labs: Will come back fasting    reports that she has never smoked. She has never used smokeless tobacco. She reports that she does not drink alcohol or use drugs.  Past Medical History:  Diagnosis Date  . Anxiety and depression   . Carpal tunnel syndrome, bilateral   . History of blood transfusion    during T&A  . HSV infection    HSV I/II  . Hypothyroid   . Perirectal abscess 4/10   h/o  . Rheumatoid arthritis (HCC)    Dr. Nickola Major  . Torn meniscus    right knee - 09/2017    Past Surgical History:  Procedure Laterality Date  . CARPAL TUNNEL RELEASE  12/15/09   and tendon clean out  . CESAREAN SECTION  1989, 1995  . INCISION AND DRAINAGE PERIRECTAL ABSCESS  4/10  . KNEE SURGERY Right 09/2017  . TONSILLECTOMY AND ADENOIDECTOMY    . TOTAL ABDOMINAL HYSTERECTOMY  9/07  . TUBAL LIGATION  1995    Current Outpatient Medications  Medication Sig Dispense Refill  . levothyroxine (SYNTHROID) 112 MCG tablet TAKE 1 TABLET(112 MCG) BY MOUTH DAILY 90 tablet 3  . montelukast (SINGULAIR) 10 MG tablet Take 10 mg by mouth daily.    . Multiple Vitamin (MULTIVITAMIN) tablet Take 1 tablet by mouth daily.    .  sertraline (ZOLOFT) 50 MG tablet TAKE 1 TABLET(50 MG) BY MOUTH DAILY 90 tablet 0   No current facility-administered medications for this visit.     Family History  Problem Relation Age of Onset  . Diabetes Maternal Grandfather   . Cancer Father        colon and bone  . Rheumatic fever Mother   . Bell's palsy Mother   . Heart disease Mother   . Heart attack Brother        over 39 years old  . Thyroid disease Neg Hx     Review of Systems  All other systems reviewed and are negative.   Exam:   BP 138/80   Pulse 80   Temp (!) 96.8 F (36 C) (Temporal)   Ht 5' 0.5" (1.537 m)   Wt 247 lb 3.2 oz (112.1 kg)   LMP 11/18/2005   BMI 47.48 kg/m   Height:   Height: 5' 0.5" (153.7 cm)  Ht Readings from Last 3 Encounters:  08/17/19 5' 0.5" (1.537 m)  07/19/19 5\' 1"  (1.549 m)  07/15/18 5\' 1"  (1.549 m)    General appearance: alert, cooperative and appears stated age Head: Normocephalic, without obvious abnormality, atraumatic Neck: no adenopathy, supple,  symmetrical, trachea midline and thyroid normal to inspection and palpation Lungs: clear to auscultation bilaterally Breasts: normal appearance, no masses or tenderness Heart: regular rate and rhythm Abdomen: soft, non-tender; bowel sounds normal; no masses,  no organomegaly Extremities: extremities normal, atraumatic, no cyanosis or edema Skin: Skin color, texture, turgor normal. No rashes or lesions Lymph nodes: Cervical, supraclavicular, and axillary nodes normal. No abnormal inguinal nodes palpated Neurologic: Grossly normal   Pelvic: External genitalia:  no lesions              Urethra:  normal appearing urethra with no masses, tenderness or lesions              Bartholins and Skenes: normal                 Vagina: normal appearing vagina with normal color and discharge, no lesions              Cervix: absent              Pap taken: No. Bimanual Exam:  Uterus:  uterus absent              Adnexa: no mass, fullness,  tenderness               Rectovaginal: Confirms               Anus:  normal sphincter tone, no lesions  Chaperone was present for exam.  A:  Well Woman with normal exam PMP, no HRT H/O TAH Depression Hypothyroidism H/o RA, not seeing rheumatology H/o perirectal abscess 4/10 Obesity  P:   Mammogram guidelines reviewed pap smear not indicated Zoloft refill completed for #90/4RF Will return for fasting lipids, CMP, Vit D Tdap and flu shot given today.  Verbal consent obtained for both vaccinations. Colonoscopy is UTD Return annually or prn

## 2019-08-23 DIAGNOSIS — Z20828 Contact with and (suspected) exposure to other viral communicable diseases: Secondary | ICD-10-CM | POA: Diagnosis not present

## 2019-08-24 ENCOUNTER — Other Ambulatory Visit (INDEPENDENT_AMBULATORY_CARE_PROVIDER_SITE_OTHER): Payer: BC Managed Care – PPO

## 2019-08-24 ENCOUNTER — Other Ambulatory Visit: Payer: Self-pay

## 2019-08-24 DIAGNOSIS — Z0000 Encounter for general adult medical examination without abnormal findings: Secondary | ICD-10-CM

## 2019-08-25 LAB — COMPREHENSIVE METABOLIC PANEL
ALT: 17 IU/L (ref 0–32)
AST: 15 IU/L (ref 0–40)
Albumin/Globulin Ratio: 1.9 (ref 1.2–2.2)
Albumin: 4.3 g/dL (ref 3.8–4.9)
Alkaline Phosphatase: 69 IU/L (ref 39–117)
BUN/Creatinine Ratio: 22 (ref 9–23)
BUN: 15 mg/dL (ref 6–24)
Bilirubin Total: 0.3 mg/dL (ref 0.0–1.2)
CO2: 25 mmol/L (ref 20–29)
Calcium: 9.1 mg/dL (ref 8.7–10.2)
Chloride: 102 mmol/L (ref 96–106)
Creatinine, Ser: 0.68 mg/dL (ref 0.57–1.00)
GFR calc Af Amer: 112 mL/min/{1.73_m2} (ref >59)
GFR calc non Af Amer: 97 mL/min/{1.73_m2} (ref >59)
Globulin, Total: 2.3 g/dL (ref 1.5–4.5)
Glucose: 97 mg/dL (ref 65–99)
Potassium: 4.9 mmol/L (ref 3.5–5.2)
Sodium: 142 mmol/L (ref 134–144)
Total Protein: 6.6 g/dL (ref 6.0–8.5)

## 2019-08-25 LAB — LIPID PANEL
Chol/HDL Ratio: 4.3 ratio (ref 0.0–4.4)
Cholesterol, Total: 238 mg/dL — ABNORMAL HIGH (ref 100–199)
HDL: 56 mg/dL (ref >39)
LDL Chol Calc (NIH): 157 mg/dL — ABNORMAL HIGH (ref 0–99)
Triglycerides: 138 mg/dL (ref 0–149)
VLDL Cholesterol Cal: 25 mg/dL (ref 5–40)

## 2019-08-25 LAB — VITAMIN D 25 HYDROXY (VIT D DEFICIENCY, FRACTURES): Vit D, 25-Hydroxy: 15.7 ng/mL — ABNORMAL LOW (ref 30.0–100.0)

## 2019-08-30 DIAGNOSIS — Z20828 Contact with and (suspected) exposure to other viral communicable diseases: Secondary | ICD-10-CM | POA: Diagnosis not present

## 2019-08-31 ENCOUNTER — Telehealth: Payer: Self-pay | Admitting: *Deleted

## 2019-08-31 NOTE — Telephone Encounter (Signed)
Left voice mail to call back 

## 2019-08-31 NOTE — Telephone Encounter (Signed)
-----   Message from Megan Salon, MD sent at 08/30/2019 10:13 AM EDT ----- Please let pt know her vit D continues to be low.  She doesn't take Vit D typically but if she is willing, she should take 50K weekly for 12 weeks and have it rechecked after that time.  Lipids are 238 and LDLs are 157.  According to cardiovascular risk calculator, her risk for CVD disease over next 10 years is 3.8% so no treatment recommended at this time.  CMP is normal.

## 2019-09-07 NOTE — Telephone Encounter (Signed)
LM for pt to call back. Second attempt  

## 2019-09-08 NOTE — Telephone Encounter (Signed)
Patient left voicemail returning call to [place]. Patient stated that she is out of town and will follow up regarding results when she returns on Monday (09/13/2019).

## 2019-09-08 NOTE — Telephone Encounter (Signed)
Message left to return call to Triage Nurse at [phone].    

## 2019-09-13 DIAGNOSIS — Z20828 Contact with and (suspected) exposure to other viral communicable diseases: Secondary | ICD-10-CM | POA: Diagnosis not present

## 2019-09-15 MED ORDER — VITAMIN D (ERGOCALCIFEROL) 1.25 MG (50000 UNIT) PO CAPS: 50000.0000 [IU] | ORAL_CAPSULE | ORAL | 0 refills | Status: DC

## 2019-09-15 NOTE — Telephone Encounter (Signed)
Patient returned call and notified of results as seen below from Dr. Sabra Heck and she verbalized understanding. Patient agreeable to take prescription vitamin d. Pharmacy confirmed as [facility] [address] in [place]. 3 month lab recheck scheduled for 12-08-19 at [place]. Patient agreeable to date and time of appointment. Prescription for vitamin d #12, 0RF sent to confirmed pharmacy.   Will close encounter.

## 2019-12-03 ENCOUNTER — Other Ambulatory Visit: Payer: Self-pay | Admitting: Obstetrics & Gynecology

## 2019-12-06 ENCOUNTER — Telehealth: Payer: Self-pay | Admitting: Obstetrics & Gynecology

## 2019-12-06 NOTE — Telephone Encounter (Signed)
Message left to return call to [facility] at [phone].   Patient needs to schedule lab appointment for vitamin d recheck.

## 2019-12-06 NOTE — Telephone Encounter (Signed)
Patient canceled her upcoming lab appointment 12/08/19 via the automated reminder call. I left her a message to call and reschedule.

## 2019-12-07 ENCOUNTER — Telehealth: Payer: Self-pay | Admitting: Obstetrics & Gynecology

## 2019-12-07 DIAGNOSIS — E559 Vitamin D deficiency, unspecified: Secondary | ICD-10-CM

## 2019-12-07 NOTE — Telephone Encounter (Signed)
Patient has a vitamin d lab appointment 12/22/19 and would like to confirm that the diagnosis code will be for vitamin d deficiency.

## 2019-12-07 NOTE — Telephone Encounter (Signed)
Spoke with patient. Patient r/s repeat Vit D lab due to positive Covid19 on 1/2, still experienicng symptoms. Did not take Rx Vit D for 2 wks. Lab r/s to 2/3.   08/24/19 Vit D 15.7, Rx vit D sent with recommendations to return in 3 mo for repeat lab.   Advised patient Future lab order placed for Vit D lab, Dx: Vit D deficiency (E55.9). Patient has additional questions for business office, message forwarded for return call.   Routing to provider for final review. Patient is agreeable to disposition. Will close encounter.  Cc: Ardis Rowan

## 2019-12-08 ENCOUNTER — Other Ambulatory Visit: Payer: BC Managed Care – PPO

## 2019-12-14 NOTE — Telephone Encounter (Signed)
Pt has lab appt for Vit D level on 12/22/2019.  Rx refused at this time.   Will route to Dr Hyacinth Meeker for review and will close encounter.

## 2019-12-15 DIAGNOSIS — H66001 Acute suppurative otitis media without spontaneous rupture of ear drum, right ear: Secondary | ICD-10-CM | POA: Diagnosis not present

## 2019-12-15 DIAGNOSIS — H6983 Other specified disorders of Eustachian tube, bilateral: Secondary | ICD-10-CM | POA: Diagnosis not present

## 2019-12-20 DIAGNOSIS — H6983 Other specified disorders of Eustachian tube, bilateral: Secondary | ICD-10-CM | POA: Diagnosis not present

## 2019-12-20 DIAGNOSIS — Z9622 Myringotomy tube(s) status: Secondary | ICD-10-CM | POA: Diagnosis not present

## 2019-12-22 ENCOUNTER — Other Ambulatory Visit: Payer: BC Managed Care – PPO

## 2019-12-29 ENCOUNTER — Telehealth: Payer: Self-pay | Admitting: Obstetrics & Gynecology

## 2019-12-29 ENCOUNTER — Other Ambulatory Visit: Payer: Self-pay

## 2019-12-29 NOTE — Telephone Encounter (Signed)
Patient canceled her vitamin d lab appointment today. She will call next week to reschedule.

## 2020-01-25 DIAGNOSIS — H25013 Cortical age-related cataract, bilateral: Secondary | ICD-10-CM | POA: Diagnosis not present

## 2020-01-25 DIAGNOSIS — H2513 Age-related nuclear cataract, bilateral: Secondary | ICD-10-CM | POA: Diagnosis not present

## 2020-01-25 DIAGNOSIS — H04123 Dry eye syndrome of bilateral lacrimal glands: Secondary | ICD-10-CM | POA: Diagnosis not present

## 2020-01-25 DIAGNOSIS — H43393 Other vitreous opacities, bilateral: Secondary | ICD-10-CM | POA: Diagnosis not present

## 2020-02-10 ENCOUNTER — Other Ambulatory Visit: Payer: Self-pay

## 2020-02-11 ENCOUNTER — Other Ambulatory Visit (INDEPENDENT_AMBULATORY_CARE_PROVIDER_SITE_OTHER): Payer: BC Managed Care – PPO

## 2020-02-11 DIAGNOSIS — E559 Vitamin D deficiency, unspecified: Secondary | ICD-10-CM | POA: Diagnosis not present

## 2020-02-12 LAB — VITAMIN D 25 HYDROXY (VIT D DEFICIENCY, FRACTURES): Vit D, 25-Hydroxy: 16.3 ng/mL — ABNORMAL LOW (ref 30.0–100.0)

## 2020-02-14 ENCOUNTER — Telehealth: Payer: Self-pay

## 2020-02-14 NOTE — Telephone Encounter (Signed)
Left message for call back to schedule 12 week lab appointment and go over RX order.

## 2020-02-14 NOTE — Telephone Encounter (Signed)
-----   Message from Jerene Bears, MD sent at 02/13/2020 10:13 PM EDT ----- Please let pt know her vit D is still low.  If she is willing, I would like her to take Vit D 50K twice weekly and recheck level in 12 weeks.  Thanks.

## 2020-02-15 ENCOUNTER — Other Ambulatory Visit: Payer: Self-pay

## 2020-02-15 MED ORDER — VITAMIN D (ERGOCALCIFEROL) 1.25 MG (50000 UNIT) PO CAPS: 50000.0000 [IU] | ORAL_CAPSULE | ORAL | 0 refills | Status: DC

## 2020-02-15 NOTE — Telephone Encounter (Signed)
Spoke with patient and send vitamin D 50k 2 times weekly to [facility] on [address]. Scheduled patient for lab in 12 weeks.

## 2020-04-02 ENCOUNTER — Other Ambulatory Visit: Payer: Self-pay | Admitting: Obstetrics & Gynecology

## 2020-05-07 ENCOUNTER — Other Ambulatory Visit: Payer: Self-pay | Admitting: Obstetrics & Gynecology

## 2020-05-24 ENCOUNTER — Other Ambulatory Visit: Payer: Self-pay

## 2020-07-04 ENCOUNTER — Other Ambulatory Visit (INDEPENDENT_AMBULATORY_CARE_PROVIDER_SITE_OTHER): Payer: BC Managed Care – PPO

## 2020-07-04 ENCOUNTER — Other Ambulatory Visit: Payer: Self-pay | Admitting: *Deleted

## 2020-07-04 ENCOUNTER — Other Ambulatory Visit: Payer: Self-pay

## 2020-07-04 DIAGNOSIS — E559 Vitamin D deficiency, unspecified: Secondary | ICD-10-CM

## 2020-07-05 LAB — VITAMIN D 25 HYDROXY (VIT D DEFICIENCY, FRACTURES): Vit D, 25-Hydroxy: 20 ng/mL — ABNORMAL LOW (ref 30.0–100.0)

## 2020-07-06 DIAGNOSIS — H43812 Vitreous degeneration, left eye: Secondary | ICD-10-CM | POA: Diagnosis not present

## 2020-07-18 ENCOUNTER — Ambulatory Visit (INDEPENDENT_AMBULATORY_CARE_PROVIDER_SITE_OTHER): Payer: BC Managed Care – PPO | Admitting: Endocrinology

## 2020-07-18 ENCOUNTER — Other Ambulatory Visit: Payer: Self-pay

## 2020-07-18 ENCOUNTER — Encounter: Payer: Self-pay | Admitting: Endocrinology

## 2020-07-18 ENCOUNTER — Other Ambulatory Visit: Payer: Self-pay | Admitting: Obstetrics & Gynecology

## 2020-07-18 VITALS — BP 124/78 | HR 94 | Ht 60.5 in | Wt 245.8 lb

## 2020-07-18 DIAGNOSIS — E038 Other specified hypothyroidism: Secondary | ICD-10-CM | POA: Diagnosis not present

## 2020-07-18 DIAGNOSIS — E063 Autoimmune thyroiditis: Secondary | ICD-10-CM | POA: Diagnosis not present

## 2020-07-18 LAB — TSH: TSH: 2.12 u[IU]/mL (ref 0.35–4.50)

## 2020-07-18 LAB — T4, FREE: Free T4: 0.73 ng/dL (ref 0.60–1.60)

## 2020-07-18 NOTE — Progress Notes (Signed)
Subjective:    Patient ID: Carla Lawrence, female    DOB: January 21, 1961, 59 y.o.   MRN: [id number]  HPI Pt returns for f/u of hypothyroidism (dx'ed 1973; she has been on prescribed thyroid hormone therapy since then; she has never had thyroid imaging).  pt states she feels well in general.  She takes synthroid as rx'ed.   Past Medical History:  Diagnosis Date  . Anxiety and depression   . Carpal tunnel syndrome, bilateral   . History of blood transfusion    during T&A  . HSV infection    HSV I/II  . Hypothyroid   . Perirectal abscess 4/10   h/o  . Rheumatoid arthritis (HCC)    Dr. Nickola Major  . Torn meniscus    right knee - 09/2017    Past Surgical History:  Procedure Laterality Date  . CARPAL TUNNEL RELEASE  12/15/09   and tendon clean out  . CESAREAN SECTION  1989, 1995  . INCISION AND DRAINAGE PERIRECTAL ABSCESS  4/10  . KNEE SURGERY Right 09/2017  . TONSILLECTOMY AND ADENOIDECTOMY    . TOTAL ABDOMINAL HYSTERECTOMY  9/07  . TUBAL LIGATION  1995    Social History   Socioeconomic History  . Marital status: Married    Spouse name: Not on file  . Number of children: Not on file  . Years of education: Not on file  . Highest education level: Not on file  Occupational History  . Not on file  Tobacco Use  . Smoking status: Never Smoker  . Smokeless tobacco: Never Used  Substance and Sexual Activity  . Alcohol use: No  . Drug use: No  . Sexual activity: Yes    Partners: Male    Birth control/protection: Surgical, Spermicide    Comment: TAH  Other Topics Concern  . Not on file  Social History Narrative  . Not on file   Social Determinants of Health   Financial Resource Strain:   . Difficulty of Paying Living Expenses: Not on file  Food Insecurity:   . Worried About Programme researcher, broadcasting/film/video in the Last Year: Not on file  . Ran Out of Food in the Last Year: Not on file  Transportation Needs:   . Lack of Transportation (Medical): Not on file  . Lack of Transportation  (Non-Medical): Not on file  Physical Activity:   . Days of Exercise per Week: Not on file  . Minutes of Exercise per Session: Not on file  Stress:   . Feeling of Stress : Not on file  Social Connections:   . Frequency of Communication with Friends and Family: Not on file  . Frequency of Social Gatherings with Friends and Family: Not on file  . Attends Religious Services: Not on file  . Active Member of Clubs or Organizations: Not on file  . Attends Banker Meetings: Not on file  . Marital Status: Not on file  Intimate Partner Violence:   . Fear of Current or Ex-Partner: Not on file  . Emotionally Abused: Not on file  . Physically Abused: Not on file  . Sexually Abused: Not on file    Current Outpatient Medications on File Prior to Visit  Medication Sig Dispense Refill  . levothyroxine (SYNTHROID) 112 MCG tablet TAKE 1 TABLET(112 MCG) BY MOUTH DAILY 90 tablet 3  . montelukast (SINGULAIR) 10 MG tablet Take 10 mg by mouth daily.    . Multiple Vitamin (MULTIVITAMIN) tablet Take 1 tablet by mouth daily.    Marland Kitchen  sertraline (ZOLOFT) 50 MG tablet TAKE 1 TABLET(50 MG) BY MOUTH DAILY 90 tablet 4   No current facility-administered medications on file prior to visit.    Allergies  Allergen Reactions  . Sulfa Antibiotics Rash    Family History  Problem Relation Age of Onset  . Diabetes Maternal Grandfather   . Cancer Father        colon and bone  . Rheumatic fever Mother   . Bell's palsy Mother   . Heart disease Mother   . Heart attack Brother        over 44 years old  . Thyroid disease Neg Hx     BP 124/78   Pulse 94   Ht 5' 0.5" (1.537 m)   Wt 245 lb 12.8 oz (111.5 kg)   LMP 11/18/2005   SpO2 98%   BMI 47.21 kg/m    Review of Systems     Objective:   Physical Exam VITAL SIGNS:  See vs page.   GENERAL: no distress.   NECK: There is no palpable thyroid enlargement.  No thyroid nodule is palpable.  No palpable lymphadenopathy at the anterior neck.    Lab  Results  Component Value Date   TSH 2.12 07/18/2020      Assessment & Plan:  Hypothyroidism: well-replaced.  Please continue the same medication  Patient Instructions  Blood tests are requested for you today.  We'll let you know about the results.   It is best to never miss the medication.  However, if you do miss it, next best is to double up the next time. Please come back for a follow-up appointment in 1 year.

## 2020-07-18 NOTE — Patient Instructions (Addendum)
Blood tests are requested for you today.  We'll let you know about the results.  It is best to never miss the medication.  However, if you do miss it, next best is to double up the next time.  Please come back for a follow-up appointment in 1 year.   

## 2020-07-19 NOTE — Telephone Encounter (Signed)
Medication refill request: Vitamin D Last AEX: 08/17/19 SM Next AEX: 11/03/20  Last MMG (if hormonal medication request): n/a Refill authorized: Today, please advise; patient had Vitamin D level checked 07/04/20

## 2020-08-02 ENCOUNTER — Telehealth: Payer: Self-pay

## 2020-08-02 DIAGNOSIS — R921 Mammographic calcification found on diagnostic imaging of breast: Secondary | ICD-10-CM

## 2020-08-02 NOTE — Telephone Encounter (Signed)
Tried calling patient regarding mammogram recall. Patient due for yearly MMG with [facility]. No answer, left message for patient to call me back.

## 2020-08-04 NOTE — Telephone Encounter (Signed)
Patient is returning a call to [place]. °

## 2020-08-04 NOTE — Telephone Encounter (Signed)
Last MMG 07/07/19 Patient is in [facility] recall for bilateral Dx MMG at [facility] to complete 1 year f/u of right breast calcifications.   Spoke with patient. Advised as seen above. Patient declines assistance with scheduling, states she will contact [facility] directly to schedule.  Orders placed for bilateral Dx MMG and right breast US, if needed.  Patient is aware to contact the office if she has any additional questions or needs any additional assistance with scheduling.   Routing to [employer].   Encounter closed.

## 2020-08-30 DIAGNOSIS — H66003 Acute suppurative otitis media without spontaneous rupture of ear drum, bilateral: Secondary | ICD-10-CM | POA: Diagnosis not present

## 2020-08-30 DIAGNOSIS — H7292 Unspecified perforation of tympanic membrane, left ear: Secondary | ICD-10-CM | POA: Diagnosis not present

## 2020-08-30 DIAGNOSIS — H6983 Other specified disorders of Eustachian tube, bilateral: Secondary | ICD-10-CM | POA: Diagnosis not present

## 2020-09-13 ENCOUNTER — Ambulatory Visit
Admission: RE | Admit: 2020-09-13 | Discharge: 2020-09-13 | Disposition: A | Discharge status: Home / Self Care | Payer: BC Managed Care – PPO | Source: Ambulatory Visit | Attending: Obstetrics & Gynecology | Admitting: Obstetrics & Gynecology

## 2020-09-13 ENCOUNTER — Other Ambulatory Visit: Payer: Self-pay

## 2020-09-13 DIAGNOSIS — R921 Mammographic calcification found on diagnostic imaging of breast: Secondary | ICD-10-CM

## 2020-09-25 ENCOUNTER — Other Ambulatory Visit: Payer: Self-pay | Admitting: Endocrinology

## 2020-09-25 ENCOUNTER — Other Ambulatory Visit: Payer: Self-pay | Admitting: Obstetrics & Gynecology

## 2020-09-25 NOTE — Telephone Encounter (Signed)
Medication refill request: Sertraline Last AEX:  08/17/19 Dr. Hyacinth Meeker Next AEX: none Last MMG (if hormonal medication request): n/a Refill authorized: today, please advise

## 2020-09-26 NOTE — Telephone Encounter (Signed)
Please let the patient know that a 3 month refill has been sent. She is due for an annual exam, she will need schedule prior to any more refills.

## 2020-09-29 NOTE — Telephone Encounter (Signed)
Tried calling patient, no answer. Left message for patient to call our office back to schedule.

## 2020-10-02 NOTE — Telephone Encounter (Signed)
Patient notified of message as seen below. Patient declines appointment for annual with our office; will wait and schedule with Dr. Hyacinth Meeker at new office. Patient verbalizes understanding and is agreeable. Closing encounter.

## 2020-10-04 DIAGNOSIS — E039 Hypothyroidism, unspecified: Secondary | ICD-10-CM | POA: Diagnosis not present

## 2020-10-04 DIAGNOSIS — J209 Acute bronchitis, unspecified: Secondary | ICD-10-CM | POA: Diagnosis not present

## 2020-10-04 DIAGNOSIS — J029 Acute pharyngitis, unspecified: Secondary | ICD-10-CM | POA: Diagnosis not present

## 2020-10-12 ENCOUNTER — Other Ambulatory Visit: Payer: Self-pay | Admitting: Obstetrics & Gynecology

## 2020-11-03 ENCOUNTER — Ambulatory Visit: Payer: BC Managed Care – PPO | Admitting: Obstetrics & Gynecology

## 2020-12-17 ENCOUNTER — Other Ambulatory Visit: Payer: Self-pay | Admitting: Obstetrics and Gynecology

## 2020-12-25 ENCOUNTER — Other Ambulatory Visit: Payer: Self-pay

## 2021-01-25 DIAGNOSIS — H6983 Other specified disorders of Eustachian tube, bilateral: Secondary | ICD-10-CM | POA: Diagnosis not present

## 2021-01-25 DIAGNOSIS — H66003 Acute suppurative otitis media without spontaneous rupture of ear drum, bilateral: Secondary | ICD-10-CM | POA: Diagnosis not present

## 2021-01-25 DIAGNOSIS — H906 Mixed conductive and sensorineural hearing loss, bilateral: Secondary | ICD-10-CM | POA: Diagnosis not present

## 2021-01-25 DIAGNOSIS — H7292 Unspecified perforation of tympanic membrane, left ear: Secondary | ICD-10-CM | POA: Diagnosis not present

## 2021-02-22 ENCOUNTER — Other Ambulatory Visit (HOSPITAL_BASED_OUTPATIENT_CLINIC_OR_DEPARTMENT_OTHER): Payer: Self-pay | Admitting: *Deleted

## 2021-02-22 MED ORDER — SERTRALINE HCL 50 MG PO TABS: ORAL_TABLET | ORAL | 0 refills | Status: DC

## 2021-02-22 NOTE — Telephone Encounter (Signed)
Pt called for Refill of Zoloft.  Pt has apt 03/06/21. KW CMA

## 2021-03-06 ENCOUNTER — Other Ambulatory Visit: Payer: Self-pay

## 2021-03-06 ENCOUNTER — Encounter (HOSPITAL_BASED_OUTPATIENT_CLINIC_OR_DEPARTMENT_OTHER): Payer: Self-pay | Admitting: Obstetrics & Gynecology

## 2021-03-06 ENCOUNTER — Ambulatory Visit (INDEPENDENT_AMBULATORY_CARE_PROVIDER_SITE_OTHER): Payer: BC Managed Care – PPO | Admitting: Obstetrics & Gynecology

## 2021-03-06 VITALS — BP 186/98 | HR 81 | Ht 60.25 in | Wt 245.0 lb

## 2021-03-06 DIAGNOSIS — E039 Hypothyroidism, unspecified: Secondary | ICD-10-CM

## 2021-03-06 DIAGNOSIS — Z90710 Acquired absence of both cervix and uterus: Secondary | ICD-10-CM

## 2021-03-06 DIAGNOSIS — I10 Essential (primary) hypertension: Secondary | ICD-10-CM

## 2021-03-06 DIAGNOSIS — E2839 Other primary ovarian failure: Secondary | ICD-10-CM

## 2021-03-06 DIAGNOSIS — Z01419 Encounter for gynecological examination (general) (routine) without abnormal findings: Secondary | ICD-10-CM | POA: Diagnosis not present

## 2021-03-06 DIAGNOSIS — Z0000 Encounter for general adult medical examination without abnormal findings: Secondary | ICD-10-CM

## 2021-03-06 DIAGNOSIS — Z1211 Encounter for screening for malignant neoplasm of colon: Secondary | ICD-10-CM

## 2021-03-06 DIAGNOSIS — Z780 Asymptomatic menopausal state: Secondary | ICD-10-CM

## 2021-03-06 DIAGNOSIS — M059 Rheumatoid arthritis with rheumatoid factor, unspecified: Secondary | ICD-10-CM

## 2021-03-06 DIAGNOSIS — Z6841 Body Mass Index (BMI) 40.0 and over, adult: Secondary | ICD-10-CM

## 2021-03-06 MED ORDER — HYDROCHLOROTHIAZIDE 25 MG PO TABS: 25.0000 mg | ORAL_TABLET | Freq: Every day | ORAL | 1 refills | Status: DC

## 2021-03-06 NOTE — Progress Notes (Signed)
60 y.o. G2P2 Married White or Caucasian female here for annual exam.  Doing well but had ear infection in May and had ruptured tympanic membrane.      Continues to be frustrated with weight.  Does not want to be on many medications.  Aware BP elevated today and we should start treatment as has been elevated in the past.    Denies vaginal bleeding.  Patient's last menstrual period was 11/18/2005.          Sexually active: Yes.    The current method of family planning is status post hysterectomy.    Exercising: no Smoker:  yes  Health Maintenance: Pap:  2018 History of abnormal Pap:  no MMG:  11/13/2020 Colonoscopy:  2012.  Pt  BMD:   Ordered for October TDaP:  07/2019 Pneumonia vaccine(s):  n/a Shingrix:   Discussed  Hep C testing: 2016 Screening Labs: ordered   reports that she has never smoked. She has never used smokeless tobacco. She reports that she does not use drugs.  Past Medical History:  Diagnosis Date  . Anxiety and depression   . Carpal tunnel syndrome, bilateral   . History of blood transfusion    during T&A  . HSV infection    HSV I/II  . Hypothyroid   . Perirectal abscess 4/10   h/o  . Rheumatoid arthritis (HCC)    Dr. Nickola Major  . Torn meniscus    right knee - 09/2017    Past Surgical History:  Procedure Laterality Date  . CARPAL TUNNEL RELEASE  12/15/09   and tendon clean out  . CESAREAN SECTION  1989, 1995  . INCISION AND DRAINAGE PERIRECTAL ABSCESS  4/10  . KNEE SURGERY Right 09/2017  . TONSILLECTOMY AND ADENOIDECTOMY    . TOTAL ABDOMINAL HYSTERECTOMY  9/07  . TUBAL LIGATION  1995    Current Outpatient Medications  Medication Sig Dispense Refill  . levothyroxine (SYNTHROID) 112 MCG tablet TAKE 1 TABLET(112 MCG) BY MOUTH DAILY 90 tablet 3  . montelukast (SINGULAIR) 10 MG tablet Take 10 mg by mouth daily.    . Multiple Vitamin (MULTIVITAMIN) tablet Take 1 tablet by mouth daily.    . sertraline (ZOLOFT) 50 MG tablet TAKE 1 TABLET BY MOUTH DAILY 90  tablet 0  . Vitamin D, Ergocalciferol, (DRISDOL) 1.25 MG (50000 UNIT) CAPS capsule TAKE 1 CAPSULE BY MOUTH 2 TIMES A WEEK (Patient not taking: Reported on 03/06/2021) 24 capsule 0   No current facility-administered medications for this visit.    Family History  Problem Relation Age of Onset  . Diabetes Maternal Grandfather   . Cancer Father        colon and bone  . Rheumatic fever Mother   . Bell's palsy Mother   . Heart disease Mother   . Heart attack Brother        over 25 years old  . Thyroid disease Neg Hx     Review of Systems  All other systems reviewed and are negative.   Exam:   BP (!) 186/98   Pulse 81   Ht 5' 0.25" (1.53 m)   Wt 245 lb (111.1 kg)   LMP 11/18/2005   BMI 47.45 kg/m   Height: 5' 0.25" (153 cm)  General appearance: alert, cooperative and appears stated age Head: Normocephalic, without obvious abnormality, atraumatic Neck: no adenopathy, supple, symmetrical, trachea midline and thyroid normal to inspection and palpation Lungs: clear to auscultation bilaterally Breasts: normal appearance, no masses or tenderness Heart: regular rate  and rhythm Abdomen: soft, non-tender; bowel sounds normal; no masses,  no organomegaly Extremities: extremities normal, atraumatic, no cyanosis or edema Skin: Skin color, texture, turgor normal. No rashes or lesions Lymph nodes: Cervical, supraclavicular, and axillary nodes normal. No abnormal inguinal nodes palpated Neurologic: Grossly normal   Pelvic: External genitalia:  no lesions              Urethra:  normal appearing urethra with no masses, tenderness or lesions              Bartholins and Skenes: normal                 Vagina: normal appearing vagina with normal color and discharge, no lesions              Cervix: absent              Pap taken: No. Bimanual Exam:  Uterus:  uterus absent              Adnexa: no mass, fullness, tenderness               Rectovaginal: Confirms               Anus:  normal  sphincter tone, no lesions  Chaperone, Margret Chance, CMA, was present for exam.  Assessment/Plan: 1. Well woman exam with routine gynecological exam - pap not indicated - MMG 08/2020 - declined colonoscopy, Cologuard ordered - BMD order placed - lab work ordered today - vaccines reviewed - Pt NEEDS PCP.  Will schedule before she leaves.  2.  Morbid obesity with BMI of 45.0-49.9, adult [facility]) - discussed with pt Healthy Weight and Wellness program.  Information given.  3. History of hysterectomy - no HRT  4. Rheumatoid arthritis with positive rheumatoid factor, involving unspecified site [facility]) - has decided against treatment and does not see rheumatologist at this point  6. Acquired hypothyroidism  7. Blood tests for routine general physical examination - CBC; Future - Comprehensive metabolic panel; Future - Hemoglobin A1c; Future - VITAMIN D 25 Hydroxy (Vit-D Deficiency, Fractures); Future - Lipid panel; Future  8. Primary hypertension - hydrochlorothiazide (HYDRODIURIL) 25 MG tablet; Take 1 tablet (25 mg total) by mouth daily.  Dispense: 30 tablet; Refill: 1

## 2021-03-06 NOTE — Patient Instructions (Signed)
Healthy Weight and Wellness 

## 2021-03-07 DIAGNOSIS — I10 Essential (primary) hypertension: Secondary | ICD-10-CM | POA: Insufficient documentation

## 2021-03-07 DIAGNOSIS — Z90710 Acquired absence of both cervix and uterus: Secondary | ICD-10-CM | POA: Insufficient documentation

## 2021-03-07 DIAGNOSIS — Z6841 Body Mass Index (BMI) 40.0 and over, adult: Secondary | ICD-10-CM | POA: Insufficient documentation

## 2021-03-08 ENCOUNTER — Other Ambulatory Visit: Payer: Self-pay

## 2021-03-08 ENCOUNTER — Other Ambulatory Visit (HOSPITAL_BASED_OUTPATIENT_CLINIC_OR_DEPARTMENT_OTHER): Payer: Self-pay | Admitting: Obstetrics & Gynecology

## 2021-03-08 ENCOUNTER — Other Ambulatory Visit (HOSPITAL_BASED_OUTPATIENT_CLINIC_OR_DEPARTMENT_OTHER)
Admission: RE | Admit: 2021-03-08 | Discharge: 2021-03-08 | Disposition: A | Discharge status: Home / Self Care | Payer: BC Managed Care – PPO | Source: Ambulatory Visit | Attending: Obstetrics & Gynecology | Admitting: Obstetrics & Gynecology

## 2021-03-08 DIAGNOSIS — Z0000 Encounter for general adult medical examination without abnormal findings: Secondary | ICD-10-CM | POA: Insufficient documentation

## 2021-03-08 LAB — COMPREHENSIVE METABOLIC PANEL
ALT: 15 U/L (ref 0–44)
AST: 15 U/L (ref 15–41)
Albumin: 4.4 g/dL (ref 3.5–5.0)
Alkaline Phosphatase: 51 U/L (ref 38–126)
Anion gap: 8 (ref 5–15)
BUN: 16 mg/dL (ref 6–20)
CO2: 29 mmol/L (ref 22–32)
Calcium: 9.3 mg/dL (ref 8.9–10.3)
Chloride: 102 mmol/L (ref 98–111)
Creatinine, Ser: 0.73 mg/dL (ref 0.44–1.00)
GFR, Estimated: >60 mL/min (ref >60)
Glucose, Bld: 111 mg/dL — ABNORMAL HIGH (ref 70–99)
Potassium: 4.3 mmol/L (ref 3.5–5.1)
Sodium: 139 mmol/L (ref 135–145)
Total Bilirubin: 0.5 mg/dL (ref 0.3–1.2)
Total Protein: 7.3 g/dL (ref 6.5–8.1)

## 2021-03-08 LAB — CBC
HCT: 42 % (ref 36.0–46.0)
Hemoglobin: 13.5 g/dL (ref 12.0–15.0)
MCH: 28.2 pg (ref 26.0–34.0)
MCHC: 32.1 g/dL (ref 30.0–36.0)
MCV: 87.7 fL (ref 80.0–100.0)
Platelets: 227 10*3/uL (ref 150–400)
RBC: 4.79 MIL/uL (ref 3.87–5.11)
RDW: 13.3 % (ref 11.5–15.5)
WBC: 5.6 10*3/uL (ref 4.0–10.5)
nRBC: 0 % (ref 0.0–0.2)

## 2021-03-08 LAB — LIPID PANEL
Cholesterol: 286 mg/dL — ABNORMAL HIGH (ref 0–200)
HDL: 60 mg/dL (ref >40)
LDL Cholesterol: 198 mg/dL — ABNORMAL HIGH (ref 0–99)
Total CHOL/HDL Ratio: 4.8 RATIO
Triglycerides: 139 mg/dL (ref <150)
VLDL: 28 mg/dL (ref 0–40)

## 2021-03-08 LAB — VITAMIN D 25 HYDROXY (VIT D DEFICIENCY, FRACTURES): Vit D, 25-Hydroxy: 15.76 ng/mL — ABNORMAL LOW (ref 30–100)

## 2021-03-08 LAB — HEMOGLOBIN A1C
Hgb A1c MFr Bld: 5.8 % — ABNORMAL HIGH (ref 4.8–5.6)
Mean Plasma Glucose: 119.76 mg/dL

## 2021-03-08 MED ORDER — VITAMIN D (ERGOCALCIFEROL) 1.25 MG (50000 UNIT) PO CAPS: 50000.0000 [IU] | ORAL_CAPSULE | ORAL | 4 refills | Status: DC

## 2021-03-24 ENCOUNTER — Other Ambulatory Visit (HOSPITAL_BASED_OUTPATIENT_CLINIC_OR_DEPARTMENT_OTHER): Payer: Self-pay | Admitting: Obstetrics & Gynecology

## 2021-04-09 NOTE — Patient Instructions (Incomplete)
Recommendations from today's visit: .   Information on diet, exercise, and health maintenance recommendations are listed below. This is information to help you be sure you are on track for optimal health and monitoring.   Please look over this and let us know if you have any questions or if you have completed any of the health maintenance outside of [facility] so that we can be sure your records are up to date.  ___________________________________________________________  Thank you for choosing [facility] for your Primary Care needs. I am excited for the opportunity to partner with you to meet your health care goals. It was a pleasure meeting you today!  I am an Adult-Geriatric Nurse Practitioner with a background in caring for patients for more than 20 years. I received my Bachelor of Science in Nursing and my Doctor of Nursing Practice degrees at [employer]. I received additional fellowship training in primary care and sports medicine after receiving my doctorate degree. I provide primary care and sports medicine services to patients age 13 and older within this office. I am also a provider with the [facility] and the director of the APP Fellowship with [facility].  I am a [place] native, but have called the [place] area home for nearly 20 years and am proud to be a member of this community.   I am passionate about providing the best service to you through preventive medicine and supportive care. I consider you a part of the medical team and value your input. I work diligently to ensure that you are heard and your needs are met in a safe and effective manner. I want you to feel comfortable with me as your provider and want you to know that your health concerns are important to me.   For your information, our office hours are Monday- Friday 8:00 AM - 5:00 PM At this time I am not in the office on Wednesdays.  If  you have questions or concerns, please call our office at [phone] or send us a MyChart message and we will respond as quickly as possible.   For all urgent or time sensitive needs we ask that you please call the office to avoid delays. MyChart is not constantly monitored and replies may take up to 72 business hours.  MyChart Policy: . MyChart allows for you to see your visit notes, after visit summary, provider recommendations, lab and tests results, make an appointment, request refills, and contact your provider or the office for non-urgent questions or concerns.  . Providers are seeing patients during normal business hours and do not have built in time to review MyChart messages. We ask that you allow a minimum of 72 business hours for MyChart message responses.  . Complex MyChart concerns may require a visit. Your provider may request you schedule a virtual or in person visit to ensure we are providing the best care possible. . MyChart messages sent after 4:00 PM on Friday will not be received by the provider until Monday morning.    Lab and Test Results: . You will receive your lab and test results on MyChart as soon as they are completed and results have been sent by the lab or testing facility. Due to this service, you will receive your results BEFORE your provider.  . Please allow a minimum of 72 business hours for your provider to receive and review lab and test results and contact you about.   .   Most lab and test result comments from the provider will be sent through MyChart. Your provider may recommend changes to the plan of care, follow-up visits, repeat testing, ask questions, or request an office visit to discuss these results. You may reply directly to this message or call the office at [phone] to provide information for the provider or set up an appointment. . In some instances, you will be called with test results and recommendations. Please let us know if this is preferred  and we will make note of this in your chart to provide this for you.    . If you have not heard a response to your lab or test results in 72 business hours, please call the office to let us know.   After Hours: . For all non-emergency after hours needs, please call the office at [phone] and select the option to reach the on-call provider service. On-call services are shared between multiple [facility] offices and therefore it will not be possible to speak directly with your provider. On-call providers may provide medical advice and recommendations, but are unable to provide refills for maintenance medications.  . For all emergency or urgent medical needs after normal business hours, we recommend that you seek care at the closest Urgent Care or Emergency Department to ensure appropriate treatment in a timely manner.  . [facility] has a 24 hour emergency room located on the ground floor for your convenience.    Please do not hesitate to reach out to us with concerns.   Thank you, again, for choosing me as your health care partner. I appreciate your trust and look forward to learning more about you.   SaraBeth Early, DNP, AGNP-c ___________________________________________________________  Health Maintenance Recommendations Screening Testing  Mammogram  Every 1 -2 years based on history and risk factors  Starting at age 40  Pap Smear  Ages 21-39 every 3 years  Ages 30-65 every 5 years with HPV testing  More frequent testing may be required based on results and history  Colon Cancer Screening  Every 1-10 years based on test performed, risk factors, and history  Starting at age 45  Bone Density Screening  Every 2-10 years based on history  Starting at age 65 for women  Recommendations for men differ based on medication usage, history, and risk factors  AAA Screening  One time ultrasound  Men 65-75 years old who have every smoked  Lung  Cancer Screening  Low Dose Lung CT every 12 months  Age 55-80 years with a 30 pack-year smoking history who still smoke or who have quit within the last 15 years  Screening Labs  Routine  Labs: Complete Blood Count (CBC), Complete Metabolic Panel (CMP), Cholesterol (Lipid Panel)  Every 6-12 months based on history and medications  May be recommended more frequently based on current conditions or previous results  Hemoglobin A1c Lab  Every 3-12 months based on history and previous results  Starting at age 45 or earlier with diagnosis of diabetes, high cholesterol, BMI >26, and/or risk factors  Frequent monitoring for patients with diabetes to ensure blood sugar control  Thyroid Panel (TSH w/ T3 & T4)  Every 6 months based on history, symptoms, and risk factors  May be repeated more often if on medication  HIV  One time testing for all patients 13 and older  May be repeated more frequently for patients with increased risk factors or exposure  Hepatitis C  One time testing for all   patients 18 and older  May be repeated more frequently for patients with increased risk factors or exposure  Gonorrhea, Chlamydia  Every 12 months for all sexually active persons 13-24 years  Additional monitoring may be recommended for those who are considered high risk or who have symptoms  PSA  Men 40-54 years old with risk factors  Additional screening may be recommended from age 55-69 based on risk factors, symptoms, and history  Vaccine Recommendations  Tetanus Booster  All adults every 10 years  Flu Vaccine  All patients 6 months and older every year  COVID Vaccine  All patients 12 years and older  Initial dosing with booster  May recommend additional booster based on age and health history  HPV Vaccine  2 doses all patients age 9-26  Dosing may be considered for patients over 26  Shingles Vaccine (Shingrix)  2 doses all adults 55 years and older  Pneumonia  (Pneumovax 23)  All adults 65 years and older  May recommend earlier dosing based on health history  Pneumonia (Prevnar 13)  All adults 65 years and older  Dosed 1 year after Pneumovax 23  Additional Screening, Testing, and Vaccinations may be recommended on an individualized basis based on family history, health history, risk factors, and/or exposure.  __________________________________________________________  Diet Recommendations for All Patients  I recommend that all patients maintain a diet low in saturated fats, carbohydrates, and cholesterol. While this can be challenging at first, it is not impossible and small changes can make big differences.  Things to try: . Decreasing the amount of soda, sweet tea, and/or juice to one or less per day and replace with water o While water is always the first choice, if you do not like water you may consider - adding a water additive without sugar to improve the taste - other sugar free drinks . Replace potatoes with a brightly colored vegetable at dinner . Use healthy oils, such as canola oil or olive oil, instead of butter or hard margarine . Limit your bread intake to two pieces or less a day . Replace regular pasta with low carb pasta options . Bake, broil, or grill foods instead of frying . Monitor portion sizes  . Eat smaller, more frequent meals throughout the day instead of large meals  An important thing to remember is, if you love foods that are not great for your health, you don't have to give them up completely. Instead, allow these foods to be a reward when you have done well. Allowing yourself to still have special treats every once in a while is a nice way to tell yourself thank you for working hard to keep yourself healthy.   Also remember that every day is a new day. If you have a bad day and "fall off the wagon", you can still climb right back up and keep moving along on your journey!  We have resources available to help  you!  Some websites that may be helpful include: . www.MyPlate.gov  . Www.VeryWellFit.com _____________________________________________________________  Activity Recommendations for All Patients  I recommend that all adults get at least 20 minutes of moderate physical activity that elevates your heart rate at least 5 days out of the week.  Some examples include: . Walking or jogging at a pace that allows you to carry on a conversation . Cycling (stationary bike or outdoors) . Water aerobics . Yoga . Weight lifting . Dancing If physical limitations prevent you from putting stress on your joints, exercise   in a pool or seated in a chair are excellent options.  Do determine your MAXIMUM heart rate for activity: YOUR AGE - 220 = MAX HeartRate   Remember! . Do not push yourself too hard.  . Start slowly and build up your pace, speed, weight, time in exercise, etc.  . Allow your body to rest between exercise and get good sleep. . You will need more water than normal when you are exerting yourself. Do not wait until you are thirsty to drink. Drink with a purpose of getting in at least 8, 8 ounce glasses of water a day plus more depending on how much you exercise and sweat.    If you begin to develop dizziness, chest pain, abdominal pain, jaw pain, shortness of breath, headache, vision changes, lightheadedness, or other concerning symptoms, stop the activity and allow your body to rest. If your symptoms are severe, seek emergency evaluation immediately. If your symptoms are concerning, but not severe, please let us know so that we can recommend further evaluation.   ________________________________________________________________

## 2021-04-09 NOTE — Progress Notes (Deleted)
  Carla Clamp, DNP, AGNP-c Primary Care Services ______________________________________________________________________________________________________________________________________________  HPI Carla Lawrence is a 60 y.o. female presenting to [facility] today to establish care.    Patient Care Team: Patient, No Pcp Per (Inactive) as PCP - General (General Practice)   Last CPE: *** Other providers seen: ***  Concerns today: .    Narrative: Carla Lawrence is {MARITAL STATUS:22092:::1} She has *** children. *** currently living at home. She reports she {IS SAFE:19720} in his current relationships and home environment.  She {Action; does/does not:19097} have a history or partner abuse.  She is currently {DESC; EMPLOYMENT STATUS:32210}.  She endorses {history; activity/diet:30389}.  She {Actions; denies/admits to:5300} nicotine use, {Actions; denies/reports/admits to:19208}{Drug Use:32241}, {Actions; denies/admits to:5300} {Alcohol:20592} She {Sexually Active/Partners:[phone]}  She reports {Regular/irregular menstrual period abdominal pain hpi md:30583} She {ACTION; IS/IS OQH:[id number]} planning pregnancy in the near future.  Contraceptive options include {contraception:315051} She reports STI history of {STDs:30427:::1} and {Actions; denies/reports/admits to:19208} concerns for STI today.  She {Actions; denies-reports:120008} recent changes to bowel habits, {Actions; denies-reports:120008} recent changes to bladder habits, {Actions; denies-reports:120008} recent changes to skin.  She {Actions; denies-reports:120008} recent mood related changes. PHQ and GAD listed below.  PHQ9 Today: No flowsheet data found. GAD7 Today: No flowsheet data found.  Health Maintenance Due  Topic Date Due  . COVID-19 Vaccine (1) Never done  . HIV Screening  Never done  . COLONOSCOPY (Pts 45-85yrs Insurance coverage will  need to be confirmed)  Never done     PMH Past Medical History:  Diagnosis Date  . Anxiety and depression   . Carpal tunnel syndrome, bilateral   . History of blood transfusion    during T&A  . HSV infection    HSV I/II  . Hypothyroid   . Perirectal abscess 4/10   h/o  . Rheumatoid arthritis (HCC)    Dr. Nickola Major  . Torn meniscus    right knee - 09/2017    ROS All review of systems negative except what is listed in the HPI  PHYSICAL EXAM {PE:3041131:a:"***"} {PHYSICAL EXAM WITH PROVIDER CHOICES:22563}  ASSESSMENT AND PLAN Problem List Items Addressed This Visit   None     Education provided today during visit and on AVS for patient to review at home.  Diet and Exercise recommendations provided.  Current diagnoses and recommendations discussed. HM recommendations reviewed with recommendations.    Outpatient Encounter Medications as of 04/10/2021  Medication Sig  . hydrochlorothiazide (HYDRODIURIL) 25 MG tablet Take 1 tablet (25 mg total) by mouth daily.  Marland Kitchen levothyroxine (SYNTHROID) 112 MCG tablet TAKE 1 TABLET(112 MCG) BY MOUTH DAILY  . montelukast (SINGULAIR) 10 MG tablet Take 10 mg by mouth daily.  . Multiple Vitamin (MULTIVITAMIN) tablet Take 1 tablet by mouth daily.  . sertraline (ZOLOFT) 50 MG tablet TAKE 1 TABLET BY MOUTH DAILY  . Vitamin D, Ergocalciferol, (DRISDOL) 1.25 MG (50000 UNIT) CAPS capsule Take 1 capsule (50,000 Units total) by mouth every 7 (seven) days.   No facility-administered encounter medications on file as of 04/10/2021.    No follow-ups on file.  Time: ***minutes, >50% spent counseling, care coordination, chart review, and documentation.   Tollie Eth, DNP, AGNP-c

## 2021-04-10 ENCOUNTER — Ambulatory Visit (HOSPITAL_BASED_OUTPATIENT_CLINIC_OR_DEPARTMENT_OTHER): Payer: BC Managed Care – PPO | Admitting: Nurse Practitioner

## 2021-05-15 ENCOUNTER — Ambulatory Visit (HOSPITAL_BASED_OUTPATIENT_CLINIC_OR_DEPARTMENT_OTHER): Payer: BC Managed Care – PPO | Admitting: Nurse Practitioner

## 2021-05-15 ENCOUNTER — Encounter (HOSPITAL_BASED_OUTPATIENT_CLINIC_OR_DEPARTMENT_OTHER): Payer: Self-pay

## 2021-05-15 DIAGNOSIS — R141 Gas pain: Secondary | ICD-10-CM | POA: Insufficient documentation

## 2021-05-15 DIAGNOSIS — Z1211 Encounter for screening for malignant neoplasm of colon: Secondary | ICD-10-CM | POA: Insufficient documentation

## 2021-05-15 DIAGNOSIS — Z7689 Persons encountering health services in other specified circumstances: Secondary | ICD-10-CM | POA: Insufficient documentation

## 2021-05-15 DIAGNOSIS — R142 Eructation: Secondary | ICD-10-CM | POA: Insufficient documentation

## 2021-05-15 HISTORY — DX: Morbid (severe) obesity due to excess calories: E66.01

## 2021-05-15 NOTE — Progress Notes (Deleted)
[name], DNP, AGNP-c Primary Care Services ______________________________________________________________________  HPI Carla Lawrence is a 60 y.o. female presenting to [facility] today to establish care.   Patient Care Team: Patient, No Pcp Per (Inactive) as PCP - General (General Practice) Jerene Bears, MD as Consulting Physician (Gynecology) Christell Constant Rowe Robert., MD as Consulting Physician (Endocrinology) Romero Belling, MD as Consulting Physician (Endocrinology) Erle Crocker, MD as Consulting Physician (Ophthalmology) Judithe Modest, AUD as Consulting Physician (Audiology)  Health Maintenance    Topic Date Due Completed Date Location   HIV Screening  Never done     Colon Cancer Screening  Never done     Zoster (Shingles) Vaccine (1 of 2) Never done     Flu Shot  06/18/2021     Mammogram  09/13/2022     Tetanus Vaccine  08/16/2029     COVID-19 Vaccine  Completed     Hepatitis C Screening: USPSTF Recommendation to screen - Ages 18-79 yo.  Completed     Pneumococcal Vaccination  Aged Out     HPV Vaccine  Aged Out     Pap Smear  Discontinued       Concerns today: ***   Patient Active Problem List   Diagnosis Date Noted   Colon cancer screening 05/15/2021   Flatulence, eructation and gas pain 05/15/2021   Morbid obesity (HCC) 05/15/2021   Encounter to establish care 05/15/2021   Morbid obesity with BMI of 45.0-49.9, adult (HCC) 03/07/2021   History of hysterectomy 03/07/2021   Primary hypertension 03/07/2021   Vitamin D deficiency 06/15/2018   Hypothyroidism 05/15/2017   Rheumatoid arthritis (HCC) 08/26/2014    PHQ9 Today: No flowsheet data found. GAD7 Today: No flowsheet data found. ______________________________________________________________________ PMH Past Medical History:  Diagnosis Date   Anxiety and depression    Carpal tunnel syndrome, bilateral    History of blood  transfusion    during T&A   HSV infection    HSV I/II   Hypothyroid    Perirectal abscess 4/10   h/o   Rheumatoid arthritis (HCC)    Dr. Nickola Major   Torn meniscus    right knee - 09/2017    ROS All review of systems negative except what is listed in the HPI  PHYSICAL EXAM Physical Exam ______________________________________________________________________ ASSESSMENT AND PLAN Problem List Items Addressed This Visit     Encounter to establish care - Primary    Education provided today during visit and on AVS for patient to review at home.  Diet and Exercise recommendations provided.  Current diagnoses and recommendations discussed. HM recommendations reviewed with recommendations.    Outpatient Encounter Medications as of 05/15/2021  Medication Sig   folic acid (FOLVITE) 1 MG tablet    hydrochlorothiazide (HYDRODIURIL) 25 MG tablet Take 1 tablet (25 mg total) by mouth daily.   levothyroxine (SYNTHROID) 112 MCG tablet TAKE 1 TABLET(112 MCG) BY MOUTH DAILY   montelukast (SINGULAIR) 10 MG tablet Take 10 mg by mouth daily.   Multiple Vitamin (MULTIVITAMIN) tablet Take 1 tablet by mouth daily.   sertraline (ZOLOFT) 50 MG tablet TAKE 1 TABLET BY MOUTH DAILY   Vitamin D, Ergocalciferol, (DRISDOL) 1.25 MG (50000 UNIT) CAPS capsule Take 1 capsule (50,000 Units total) by mouth every 7 (seven) days.   No facility-administered encounter medications on file as of 05/15/2021.    No follow-ups on file.  Time: ***minutes, >50% spent counseling, care coordination, chart review, and documentation.   [place] Dec  E Zyan Coby, DNP, AGNP-c

## 2021-05-16 ENCOUNTER — Other Ambulatory Visit: Payer: Self-pay

## 2021-05-16 ENCOUNTER — Other Ambulatory Visit (HOSPITAL_BASED_OUTPATIENT_CLINIC_OR_DEPARTMENT_OTHER): Payer: Self-pay | Admitting: Obstetrics & Gynecology

## 2021-05-16 ENCOUNTER — Other Ambulatory Visit: Payer: Self-pay | Admitting: Endocrinology

## 2021-05-16 DIAGNOSIS — I10 Essential (primary) hypertension: Secondary | ICD-10-CM

## 2021-05-16 DIAGNOSIS — E063 Autoimmune thyroiditis: Secondary | ICD-10-CM

## 2021-05-16 DIAGNOSIS — E038 Other specified hypothyroidism: Secondary | ICD-10-CM

## 2021-05-16 MED ORDER — LEVOTHYROXINE SODIUM 112 MCG PO TABS: ORAL_TABLET | ORAL | 0 refills | Status: DC

## 2021-06-08 ENCOUNTER — Encounter (HOSPITAL_BASED_OUTPATIENT_CLINIC_OR_DEPARTMENT_OTHER): Payer: Self-pay | Admitting: Nurse Practitioner

## 2021-06-08 ENCOUNTER — Ambulatory Visit (HOSPITAL_BASED_OUTPATIENT_CLINIC_OR_DEPARTMENT_OTHER): Payer: BC Managed Care – PPO | Admitting: Nurse Practitioner

## 2021-06-08 ENCOUNTER — Other Ambulatory Visit: Payer: Self-pay

## 2021-06-08 VITALS — BP 147/82 | HR 80 | Ht 62.0 in | Wt 243.8 lb

## 2021-06-08 DIAGNOSIS — R7303 Prediabetes: Secondary | ICD-10-CM | POA: Diagnosis not present

## 2021-06-08 DIAGNOSIS — I10 Essential (primary) hypertension: Secondary | ICD-10-CM | POA: Diagnosis not present

## 2021-06-08 DIAGNOSIS — E038 Other specified hypothyroidism: Secondary | ICD-10-CM

## 2021-06-08 DIAGNOSIS — E782 Mixed hyperlipidemia: Secondary | ICD-10-CM | POA: Insufficient documentation

## 2021-06-08 DIAGNOSIS — E063 Autoimmune thyroiditis: Secondary | ICD-10-CM

## 2021-06-08 DIAGNOSIS — Z6841 Body Mass Index (BMI) 40.0 and over, adult: Secondary | ICD-10-CM | POA: Diagnosis not present

## 2021-06-08 DIAGNOSIS — Z1211 Encounter for screening for malignant neoplasm of colon: Secondary | ICD-10-CM | POA: Diagnosis not present

## 2021-06-08 MED ORDER — LOSARTAN POTASSIUM-HCTZ 50-12.5 MG PO TABS: 1.0000 | ORAL_TABLET | Freq: Every day | ORAL | 1 refills | Status: DC

## 2021-06-08 MED ORDER — PHENTERMINE HCL 37.5 MG PO CAPS: ORAL_CAPSULE | ORAL | 0 refills | Status: DC

## 2021-06-08 NOTE — Assessment & Plan Note (Signed)
Recent A1c 5.8% Working on weight loss and diet changes Recommend daily activity of 20 minutes per day Low fat and sodium diet with 1200-1500 calorie limit and no more than 200g of carbohydrates Will recheck A1c in 3 months at F/U Consider starting metformin if not controlled with diet and exercise.

## 2021-06-08 NOTE — Patient Instructions (Addendum)
Recommendations from today's visit: I have changed your blood pressure medication to a combination pill to help you get better control. I would like for you to check your blood pressure at home at a time when you are calm and relaxed about 3- 4 times a week and see where it is running.  Your goal BP is 100-120/70-80.  If your readings are consistently running lower than 100/70 or if you have negative side effects, please let me know If your blood pressure is consistently (two to three checks) running higher than 150/95 (either or both numbers)- then please let me know   I have sent in phentermine for weight loss- I sent 3 months worth to the pharmacy and we will plan to see how you are doing on this towards the end of the 3 months.  Information on diet, exercise, and health maintenance recommendations are listed below. This is information to help you be sure you are on track for optimal health and monitoring.   Please look over this and let us know if you have any questions or if you have completed any of the health maintenance outside of [place] so that we can be sure your records are up to date.  ___________________________________________________________  Thank you for choosing [place] at [facility] for your Primary Care needs. I am excited for the opportunity to partner with you to meet your health care goals. It was a pleasure meeting you today!  I am an Adult-Geriatric Nurse Practitioner with a background in caring for patients for more than 20 years. I provide primary care and sports medicine services to patients age 58 and older within this office. I am also the director of the APP Fellowship with [facility].   I am passionate about providing the best service to you through preventive medicine and supportive care. I consider you a part of the medical team and value your input. I work diligently to ensure that you are heard and your needs are met in a safe and  effective manner. I want you to feel comfortable with me as your provider and want you to know that your health concerns are important to me.  For your information, our office hours are Monday- Friday 8:00 AM - 5:00 PM At this time I am not in the office on Wednesdays.  If you have questions or concerns, please call our office at [phone] or send [place] a MyChart message and we will respond as quickly as possible.   For all urgent or time sensitive needs we ask that you please call the office to avoid delays. MyChart is not constantly monitored and replies may take up to 72 business hours.  MyChart Policy: MyChart allows for you to see your visit notes, after visit summary, provider recommendations, lab and tests results, make an appointment, request refills, and contact your provider or the office for non-urgent questions or concerns. Providers are seeing patients during normal business hours and do not have built in time to review MyChart messages.  We ask that you allow a minimum of 4 business days for responses to [employer]. For this reason, please do not send urgent requests through [place]. Please call the office at [phone]. Complex MyChart concerns may require a visit. Your provider may request you schedule a virtual or in person visit to ensure we are providing the best care possible. MyChart messages sent after 4:00 PM on Friday will not be received by the provider until Monday morning.  Lab and Test Results: You will receive your lab and test results on MyChart as soon as they are completed and results have been sent by the lab or testing facility. Due to this service, you will receive your results BEFORE your provider.  I review lab and tests results each morning prior to seeing patients. Some results require collaboration with other providers to ensure you are receiving the most appropriate care. For this reason, we ask that you please allow a minimum of 4 business days for  your provider to receive and review lab and test results and contact you about these.  Most lab and test result comments from the provider will be sent through [place]. Your provider may recommend changes to the plan of care, follow-up visits, repeat testing, ask questions, or request an office visit to discuss these results. You may reply directly to this message or call the office at [phone] to provide information for the provider or set up an appointment. In some instances, you will be called with test results and recommendations. Please let us know if this is preferred and we will make note of this in your chart to provide this for you.    If you have not heard a response to your lab or test results in 72 business hours, please call the office to let us know.   After Hours: For all non-emergency after hours needs, please call the office at [phone] and select the option to reach the on-call provider service. On-call services are shared between multiple [place] offices and therefore it will not be possible to speak directly with your provider. On-call providers may provide medical advice and recommendations, but are unable to provide refills for maintenance medications.  For all emergency or urgent medical needs after normal business hours, we recommend that you seek care at the closest Urgent Care or Emergency Department to ensure appropriate treatment in a timely manner.  [facility] has a 24 hour emergency room located on the ground floor for your convenience.    Please do not hesitate to reach out to [place] with concerns.   Thank you, again, for choosing me as your health care partner. I appreciate your trust and look forward to learning more about you.   Worthy Keeler, DNP, AGNP-c ___________________________________________________________  Health Maintenance Recommendations Screening Testing Mammogram Every 1 -2 years based on history and risk  factors Starting at age 76 Pap Smear Ages 21-39 every 3 years Ages 104-65 every 5 years with HPV testing More frequent testing may be required based on results and history Colon Cancer Screening Every 1-10 years based on test performed, risk factors, and history Starting at age 44 Bone Density Screening Every 2-10 years based on history Starting at age 63 for women Recommendations for men differ based on medication usage, history, and risk factors AAA Screening One time ultrasound Men 14-47 years old who have every smoked Lung Cancer Screening Low Dose Lung CT every 12 months Age 69-80 years with a 30 pack-year smoking history who still smoke or who have quit within the last 15 years  Screening Labs Routine  Labs: Complete Blood Count (CBC), Complete Metabolic Panel (CMP), Cholesterol (Lipid Panel) Every 6-12 months based on history and medications May be recommended more frequently based on current conditions or previous results Hemoglobin A1c Lab Every 3-12 months based on history and previous results Starting at age 11 or earlier with diagnosis of diabetes, high cholesterol, BMI >26, and/or risk factors Frequent monitoring  for patients with diabetes to ensure blood sugar control Thyroid Panel (TSH w/ T3 & T4) Every 6 months based on history, symptoms, and risk factors May be repeated more often if on medication HIV One time testing for all patients 58 and older May be repeated more frequently for patients with increased risk factors or exposure Hepatitis C One time testing for all patients 51 and older May be repeated more frequently for patients with increased risk factors or exposure Gonorrhea, Chlamydia Every 12 months for all sexually active persons 13-24 years Additional monitoring may be recommended for those who are considered high risk or who have symptoms PSA Men 50-68 years old with risk factors Additional screening may be recommended from age 39-69 based on risk  factors, symptoms, and history  Vaccine Recommendations Tetanus Booster All adults every 10 years Flu Vaccine All patients 6 months and older every year COVID Vaccine All patients 12 years and older Initial dosing with booster May recommend additional booster based on age and health history HPV Vaccine 2 doses all patients age 3-26 Dosing may be considered for patients over 26 Shingles Vaccine (Shingrix) 2 doses all adults 10 years and older Pneumonia (Pneumovax 23) All adults 32 years and older May recommend earlier dosing based on health history Pneumonia (Prevnar 32) All adults 69 years and older Dosed 1 year after Pneumovax 23  Additional Screening, Testing, and Vaccinations may be recommended on an individualized basis based on family history, health history, risk factors, and/or exposure.  __________________________________________________________  Diet Recommendations for All Patients  I recommend that all patients maintain a diet low in saturated fats, carbohydrates, and cholesterol. While this can be challenging at first, it is not impossible and small changes can make big differences.  Things to try: Decreasing the amount of soda, sweet tea, and/or juice to one or less per day and replace with water While water is always the first choice, if you do not like water you may consider adding a water additive without sugar to improve the taste other sugar free drinks Replace potatoes with a brightly colored vegetable at dinner Use healthy oils, such as canola oil or olive oil, instead of butter or hard margarine Limit your bread intake to two pieces or less a day Replace regular pasta with low carb pasta options Bake, broil, or grill foods instead of frying Monitor portion sizes  Eat smaller, more frequent meals throughout the day instead of large meals  An important thing to remember is, if you love foods that are not great for your health, you don't have to give them  up completely. Instead, allow these foods to be a reward when you have done well. Allowing yourself to still have special treats every once in a while is a nice way to tell yourself thank you for working hard to keep yourself healthy.   Also remember that every day is a new day. If you have a bad day and "fall off the wagon", you can still climb right back up and keep moving along on your journey!  We have resources available to help you!  Some websites that may be helpful include: www.http://carter.biz/  Www.VeryWellFit.com _____________________________________________________________  Activity Recommendations for All Patients  I recommend that all adults get at least 20 minutes of moderate physical activity that elevates your heart rate at least 5 days out of the week.  Some examples include: Walking or jogging at a pace that allows you to carry on a conversation Cycling (stationary bike or  outdoors) Water aerobics Yoga Weight lifting Dancing If physical limitations prevent you from putting stress on your joints, exercise in a pool or seated in a chair are excellent options.  Do determine your MAXIMUM heart rate for activity: YOUR AGE - 220 = MAX HeartRate   Remember! Do not push yourself too hard.  Start slowly and build up your pace, speed, weight, time in exercise, etc.  Allow your body to rest between exercise and get good sleep. You will need more water than normal when you are exerting yourself. Do not wait until you are thirsty to drink. Drink with a purpose of getting in at least 8, 8 ounce glasses of water a day plus more depending on how much you exercise and sweat.    If you begin to develop dizziness, chest pain, abdominal pain, jaw pain, shortness of breath, headache, vision changes, lightheadedness, or other concerning symptoms, stop the activity and allow your body to rest. If your symptoms are severe, seek emergency evaluation immediately. If your symptoms are concerning,  but not severe, please let us know so that we can recommend further evaluation.   ________________________________________________________________

## 2021-06-08 NOTE — Assessment & Plan Note (Signed)
Managed by endocrinology Doing well with no concerns today No changes

## 2021-06-08 NOTE — Assessment & Plan Note (Signed)
Recent lipids elevated Working on diet and exercise modifications at this time Recommend exercise 20 minutes every day Low fat and low sodium diet with no more than 1200-1500 calories and 200g of carbs per day Recommend avoiding fried/fatty foods and increasing fiber.  F/U in 3 months for repeat labs

## 2021-06-08 NOTE — Progress Notes (Signed)
t Carla Clamp, DNP, AGNP-c Primary Care Services ______________________________________________________________________  HPI Carla Lawrence is a 60 y.o. female presenting to [facility] Primary [facility] today to establish care.   Patient Care Team: Dionne Knoop, Sung Amabile, NP as PCP - General (Nurse Practitioner) Jerene Bears, MD as Consulting Physician (Gynecology) Christell Constant Rowe Robert., MD as Consulting Physician (Endocrinology) Romero Belling, MD as Consulting Physician (Endocrinology) Erle Crocker, MD as Consulting Physician (Ophthalmology) Judithe Modest, AUD as Consulting Physician (Audiology)  Previous PCP: Dr. Jenita Seashore- retired Primarily managed primary care by Dr. Hyacinth Meeker - OBGYN  Health Maintenance  Topic Date Due   HIV Screening  Never done   Colon Cancer Screening  Never done   Zoster (Shingles) Vaccine (1 of 2) Never done   Flu Shot  06/18/2021   Mammogram  09/13/2022   Tetanus Vaccine  08/16/2029   COVID-19 Vaccine  Completed   Hepatitis C Screening: USPSTF Recommendation to screen - Ages 18-79 yo.  Completed   Pneumococcal Vaccination  Aged Out   HPV Vaccine  Aged Out   Pap Smear  Discontinued     Concerns today: HTN Started on HCTZ 25mg  by GYN about 3 months ago No HA, CP, Palpitations, LE edema, vision changes High stress job No exercising, but planning to start Weight Gain Endorses weight gain over the years Limited physical activity and high stress job Had luck with phentermine about 6-7 years ago and interested in restarting Avoids soda and empty calories No diet plan, but willing to work on this Willing to increase activity Hypothyroid Sees endocrinology for this Recent dose checks have been on target No new symptoms Pre-DM and HLD Recent lab work with GYN Not following specialized diet  or exercise plan Would like to work on diet and exercise to help get back on target No increased thirst or  hunger, no paresthesias  Patient Active Problem List   Diagnosis Date Noted   Mixed hyperlipidemia 06/08/2021   Prediabetes 06/08/2021   Colon cancer screening 05/15/2021   Flatulence, eructation and gas pain 05/15/2021   Encounter to establish care 05/15/2021   BMI 40.0-44.9, adult (HCC) 03/07/2021   History of hysterectomy 03/07/2021   Primary hypertension 03/07/2021   Vitamin D deficiency 06/15/2018   Hypothyroidism 05/15/2017   Rheumatoid arthritis (HCC) 08/26/2014    PHQ9 Today: Depression screen PHQ 2/9 06/08/2021  Decreased Interest 0  Down, Depressed, Hopeless 0  PHQ - 2 Score 0  Altered sleeping 1  Tired, decreased energy 1  Change in appetite 0  Feeling bad or failure about yourself  0  Trouble concentrating 0  Moving slowly or fidgety/restless 0  PHQ-9 Score 2  Difficult doing work/chores Not difficult at all   GAD7 Today: GAD 7 : Generalized Anxiety Score 06/08/2021  Nervous, Anxious, on Edge 0  Control/stop worrying 0  Worry too much - different things 0  Trouble relaxing 0  Restless 0  Easily annoyed or irritable 0  Afraid - awful might happen 0  Total GAD 7 Score 0   ______________________________________________________________________ PMH Past Medical History:  Diagnosis Date   Anxiety and depression    Carpal tunnel syndrome, bilateral    History of blood transfusion    during T&A   HSV infection    HSV I/II   Hypothyroid    Morbid obesity (HCC) 05/15/2021   Perirectal abscess 4/10   h/o   Rheumatoid arthritis (HCC)    Dr. 6/10   Torn  meniscus    right knee - 09/2017    ROS All review of systems negative except what is listed in the HPI  PHYSICAL EXAM Physical Exam Vitals and nursing note reviewed.  Constitutional:      Appearance: Normal appearance. She is obese.  HENT:     Head: Normocephalic and atraumatic.     Right Ear: A PE tube is present.     Left Ear: Tympanic membrane normal.     Nose: Nose normal.     Mouth/Throat:      Mouth: Mucous membranes are moist.     Pharynx: Oropharynx is clear.  Eyes:     Extraocular Movements: Extraocular movements intact.     Conjunctiva/sclera: Conjunctivae normal.     Pupils: Pupils are equal, round, and reactive to light.  Neck:     Vascular: No carotid bruit.  Cardiovascular:     Rate and Rhythm: Normal rate and regular rhythm.     Pulses: Normal pulses.     Heart sounds: Normal heart sounds.  Pulmonary:     Effort: Pulmonary effort is normal.     Breath sounds: Normal breath sounds.  Abdominal:     General: Abdomen is flat. Bowel sounds are normal.     Palpations: Abdomen is soft.  Musculoskeletal:        General: Normal range of motion.     Cervical back: Normal range of motion and neck supple. No tenderness.     Right lower leg: No edema.     Left lower leg: No edema.  Lymphadenopathy:     Cervical: No cervical adenopathy.  Skin:    General: Skin is warm and dry.     Capillary Refill: Capillary refill takes less than 2 seconds.  Neurological:     General: No focal deficit present.     Mental Status: She is alert and oriented to person, place, and time.  Psychiatric:        Mood and Affect: Mood normal.        Behavior: Behavior normal.        Thought Content: Thought content normal.        Judgment: Judgment normal.   ______________________________________________________________________ ASSESSMENT AND PLAN Problem List Items Addressed This Visit     Hypothyroidism    Managed by endocrinology Doing well with no concerns today No changes       BMI 40.0-44.9, adult (HCC)    Working on diet and exercise Will start phentermine today- she has had success with this in the past Recommend 1200-1500 calorie diet with no more than 200g carbohydrates.  Avoid fried and fatty foods. Increase lean proteins and vegetables (avoid starchy vegetables).  Will f/u in 3 months.        Relevant Medications   phentermine 37.5 MG capsule   phentermine 37.5  MG capsule (Start on 07/06/2021)   phentermine 37.5 MG capsule (Start on 08/03/2021)   Primary hypertension - Primary    BP remains elevated today- labs 3 months ago Change HCTZ to losartan-HCTZ 50/12.5 combo Monitor BP at home 3-4 days a week and keep log for evaluation Goal BP <130/80 Working on increasing exercise- 20 minutes walking daily Starting phentermine to help with weight loss Monitor diet- recommend low sodium, low fat diet with 1200-1500 calories per day and no more than 200g of carbohydrates.  F/U in 3 months        Relevant Medications   losartan-hydrochlorothiazide (HYZAAR) 50-12.5 MG tablet   Colon cancer screening  Cologuard order placed       Relevant Orders   Cologuard   Mixed hyperlipidemia    Recent lipids elevated Working on diet and exercise modifications at this time Recommend exercise 20 minutes every day Low fat and low sodium diet with no more than 1200-1500 calories and 200g of carbs per day Recommend avoiding fried/fatty foods and increasing fiber.  F/U in 3 months for repeat labs       Relevant Medications   losartan-hydrochlorothiazide (HYZAAR) 50-12.5 MG tablet   Prediabetes    Recent A1c 5.8% Working on weight loss and diet changes Recommend daily activity of 20 minutes per day Low fat and sodium diet with 1200-1500 calorie limit and no more than 200g of carbohydrates Will recheck A1c in 3 months at F/U Consider starting metformin if not controlled with diet and exercise.         Education provided today during visit and on AVS for patient to review at home.  Diet and Exercise recommendations provided.  Current diagnoses and recommendations discussed. HM recommendations reviewed with recommendations.    Outpatient Encounter Medications as of 06/08/2021  Medication Sig   levothyroxine (SYNTHROID) 112 MCG tablet Take 1 tablet by mouth daily   losartan-hydrochlorothiazide (HYZAAR) 50-12.5 MG tablet Take 1 tablet by mouth daily.    montelukast (SINGULAIR) 10 MG tablet Take 10 mg by mouth daily.   Multiple Vitamin (MULTIVITAMIN) tablet Take 1 tablet by mouth daily.   phentermine 37.5 MG capsule One capsule by mouth qAM   [START ON 07/06/2021] phentermine 37.5 MG capsule One capsule by mouth qAM   [START ON 08/03/2021] phentermine 37.5 MG capsule One capsule by mouth qAM   sertraline (ZOLOFT) 50 MG tablet TAKE 1 TABLET BY MOUTH DAILY   Vitamin D, Ergocalciferol, (DRISDOL) 1.25 MG (50000 UNIT) CAPS capsule Take 1 capsule (50,000 Units total) by mouth every 7 (seven) days.   [DISCONTINUED] hydrochlorothiazide (HYDRODIURIL) 25 MG tablet TAKE 1 TABLET(25 MG) BY MOUTH DAILY   [DISCONTINUED] folic acid (FOLVITE) 1 MG tablet    No facility-administered encounter medications on file as of 06/08/2021.    Return in about 3 months (around 09/08/2021) for BP, Weight.  Time: 54 minutes, >50% spent counseling, care coordination, chart review, and documentation.   Tollie Eth, DNP, AGNP-c

## 2021-06-08 NOTE — Assessment & Plan Note (Signed)
Cologuard order placed.

## 2021-06-08 NOTE — Assessment & Plan Note (Signed)
BP remains elevated today- labs 3 months ago Change HCTZ to losartan-HCTZ 50/12.5 combo Monitor BP at home 3-4 days a week and keep log for evaluation Goal BP <130/80 Working on increasing exercise- 20 minutes walking daily Starting phentermine to help with weight loss Monitor diet- recommend low sodium, low fat diet with 1200-1500 calories per day and no more than 200g of carbohydrates.  F/U in 3 months

## 2021-06-08 NOTE — Assessment & Plan Note (Signed)
Working on diet and exercise Will start phentermine today- she has had success with this in the past Recommend 1200-1500 calorie diet with no more than 200g carbohydrates.  Avoid fried and fatty foods. Increase lean proteins and vegetables (avoid starchy vegetables).  Will f/u in 3 months.

## 2021-06-17 ENCOUNTER — Other Ambulatory Visit (HOSPITAL_BASED_OUTPATIENT_CLINIC_OR_DEPARTMENT_OTHER): Payer: Self-pay | Admitting: Obstetrics & Gynecology

## 2021-06-17 DIAGNOSIS — I10 Essential (primary) hypertension: Secondary | ICD-10-CM

## 2021-07-09 ENCOUNTER — Other Ambulatory Visit (HOSPITAL_BASED_OUTPATIENT_CLINIC_OR_DEPARTMENT_OTHER): Payer: Self-pay | Admitting: Nurse Practitioner

## 2021-07-09 DIAGNOSIS — Z6841 Body Mass Index (BMI) 40.0 and over, adult: Secondary | ICD-10-CM

## 2021-07-12 DIAGNOSIS — H5213 Myopia, bilateral: Secondary | ICD-10-CM | POA: Diagnosis not present

## 2021-07-12 DIAGNOSIS — H43393 Other vitreous opacities, bilateral: Secondary | ICD-10-CM | POA: Diagnosis not present

## 2021-07-12 DIAGNOSIS — H524 Presbyopia: Secondary | ICD-10-CM | POA: Diagnosis not present

## 2021-07-12 DIAGNOSIS — H25013 Cortical age-related cataract, bilateral: Secondary | ICD-10-CM | POA: Diagnosis not present

## 2021-07-12 DIAGNOSIS — H43812 Vitreous degeneration, left eye: Secondary | ICD-10-CM | POA: Diagnosis not present

## 2021-07-12 DIAGNOSIS — H2513 Age-related nuclear cataract, bilateral: Secondary | ICD-10-CM | POA: Diagnosis not present

## 2021-07-12 DIAGNOSIS — H52203 Unspecified astigmatism, bilateral: Secondary | ICD-10-CM | POA: Diagnosis not present

## 2021-07-18 ENCOUNTER — Ambulatory Visit: Payer: BC Managed Care – PPO | Admitting: Endocrinology

## 2021-07-18 ENCOUNTER — Other Ambulatory Visit: Payer: Self-pay

## 2021-07-18 VITALS — BP 158/80 | HR 83 | Ht 62.0 in | Wt 238.6 lb

## 2021-07-18 DIAGNOSIS — E038 Other specified hypothyroidism: Secondary | ICD-10-CM

## 2021-07-18 DIAGNOSIS — E063 Autoimmune thyroiditis: Secondary | ICD-10-CM

## 2021-07-18 LAB — T4, FREE: Free T4: 0.52 ng/dL — ABNORMAL LOW (ref 0.60–1.60)

## 2021-07-18 LAB — TSH: TSH: 18.39 u[IU]/mL — ABNORMAL HIGH (ref 0.35–5.50)

## 2021-07-18 MED ORDER — LEVOTHYROXINE SODIUM 125 MCG PO TABS
125.0000 ug | ORAL_TABLET | Freq: Every day | ORAL | 3 refills | Status: DC
Start: 2021-07-18 — End: 2022-05-09

## 2021-07-18 NOTE — Patient Instructions (Addendum)
Blood tests are requested for you today.  We'll let you know about the results.  It is best to never miss the medication.  However, if you do miss it, next best is to double up the next time.  Please come back for a follow-up appointment in 1 year.   

## 2021-07-18 NOTE — Progress Notes (Signed)
Subjective:    Patient ID: Carla Lawrence, female    DOB: [birth year], 60 y.o.   MRN: [id number]  HPI Pt returns for f/u of chronic primary hypothyroidism (dx'ed 1973; she has been on prescribed thyroid hormone therapy since dx; she has never had thyroid imaging).  pt states she feels well in general.  She takes synthroid as rx'ed.   Past Medical History:  Diagnosis Date   Anxiety and depression    Carpal tunnel syndrome, bilateral    History of blood transfusion    during T&A   HSV infection    HSV I/II   Hypothyroid    Morbid obesity (HCC) 05/15/2021   Perirectal abscess 4/10   h/o   Rheumatoid arthritis (HCC)    Dr. Nickola Major   Torn meniscus    right knee - 09/2017    Past Surgical History:  Procedure Laterality Date   CARPAL TUNNEL RELEASE  12/15/09   and tendon clean out   CESAREAN SECTION  1989, 1995   INCISION AND DRAINAGE PERIRECTAL ABSCESS  4/10   KNEE SURGERY Right 09/2017   TONSILLECTOMY AND ADENOIDECTOMY     TOTAL ABDOMINAL HYSTERECTOMY  9/07   TUBAL LIGATION  1995    Social History   Socioeconomic History   Marital status: Married    Spouse name: Not on file   Number of children: Not on file   Years of education: Not on file   Highest education level: Not on file  Occupational History   Not on file  Tobacco Use   Smoking status: Never   Smokeless tobacco: Never  Vaping Use   Vaping Use: Never used  Substance and Sexual Activity   Alcohol use: Not on file   Drug use: No   Sexual activity: Yes    Partners: Male    Birth control/protection: Surgical, Spermicide    Comment: TAH  Other Topics Concern   Not on file  Social History Narrative   Not on file   Social Determinants of Health   Financial Resource Strain: Not on file  Food Insecurity: Not on file  Transportation Needs: Not on file  Physical Activity: Not on file  Stress: Not on file  Social Connections: Not on file  Intimate Partner Violence: Not on file    Current  Outpatient Medications on File Prior to Visit  Medication Sig Dispense Refill   losartan-hydrochlorothiazide (HYZAAR) 50-12.5 MG tablet Take 1 tablet by mouth daily. 90 tablet 1   montelukast (SINGULAIR) 10 MG tablet Take 10 mg by mouth daily.     Multiple Vitamin (MULTIVITAMIN) tablet Take 1 tablet by mouth daily.     phentermine 37.5 MG capsule One capsule by mouth qAM 30 capsule 0   phentermine 37.5 MG capsule One capsule by mouth qAM 30 capsule 0   [START ON 08/03/2021] phentermine 37.5 MG capsule One capsule by mouth qAM 30 capsule 0   sertraline (ZOLOFT) 50 MG tablet TAKE 1 TABLET BY MOUTH DAILY 90 tablet 3   Vitamin D, Ergocalciferol, (DRISDOL) 1.25 MG (50000 UNIT) CAPS capsule Take 1 capsule (50,000 Units total) by mouth every 7 (seven) days. 12 capsule 4   No current facility-administered medications on file prior to visit.    Allergies  Allergen Reactions   Sulfa Antibiotics Rash    Family History  Problem Relation Age of Onset   Diabetes Maternal Grandfather    Cancer Father        lung with metastatsis all over  Rheumatic fever Mother    Bell's palsy Mother    Heart disease Mother    Heart attack Brother        over 63 years old   Thyroid disease Neg Hx     BP (!) 158/80 (BP Location: Right Arm, Patient Position: Sitting, Cuff Size: Large)   Pulse 83   Ht 5\' 2"  (1.575 m)   Wt 238 lb 9.6 oz (108.2 kg)   LMP 11/18/2005   SpO2 97%   BMI 43.64 kg/m    Review of Systems     Objective:   Physical Exam NECK: There is no palpable thyroid enlargement.  No thyroid nodule is palpable.  No palpable lymphadenopathy at the anterior neck.    Lab Results  Component Value Date   TSH 18.39 (H) 07/18/2021      Assessment & Plan:  Hypothyroidism, uncontrolled.  I have sent a prescription to your pharmacy, to increase synthroid.  Also, take on empty stomach.

## 2021-07-30 DIAGNOSIS — Z1211 Encounter for screening for malignant neoplasm of colon: Secondary | ICD-10-CM | POA: Diagnosis not present

## 2021-08-03 LAB — COLOGUARD: Cologuard: NEGATIVE

## 2021-08-03 NOTE — Progress Notes (Signed)
Cologuard results are normal. We will plan to repeat in 3 years unless there are any issues.   Have a great day! SaraBeth

## 2021-08-09 ENCOUNTER — Telehealth (HOSPITAL_BASED_OUTPATIENT_CLINIC_OR_DEPARTMENT_OTHER): Payer: Self-pay

## 2021-08-09 NOTE — Telephone Encounter (Signed)
LVM for patient on home number regarding normal Cologard results.  Instructed patient to contact the office with questions or concerns.

## 2021-08-09 NOTE — Telephone Encounter (Signed)
-----   Message from Tollie Eth, NP sent at 08/03/2021  8:12 AM EDT ----- Cologuard results are normal. We will plan to repeat in 3 years unless there are any issues.   Have a great day! SaraBeth

## 2021-09-06 ENCOUNTER — Encounter (HOSPITAL_BASED_OUTPATIENT_CLINIC_OR_DEPARTMENT_OTHER): Payer: Self-pay | Admitting: Nurse Practitioner

## 2021-09-06 ENCOUNTER — Ambulatory Visit (HOSPITAL_BASED_OUTPATIENT_CLINIC_OR_DEPARTMENT_OTHER): Payer: BC Managed Care – PPO | Admitting: Nurse Practitioner

## 2021-09-06 ENCOUNTER — Other Ambulatory Visit: Payer: Self-pay

## 2021-09-06 VITALS — BP 155/66 | HR 60 | Ht 62.0 in | Wt 237.0 lb

## 2021-09-06 DIAGNOSIS — E063 Autoimmune thyroiditis: Secondary | ICD-10-CM

## 2021-09-06 DIAGNOSIS — I10 Essential (primary) hypertension: Secondary | ICD-10-CM

## 2021-09-06 DIAGNOSIS — R7303 Prediabetes: Secondary | ICD-10-CM

## 2021-09-06 DIAGNOSIS — E782 Mixed hyperlipidemia: Secondary | ICD-10-CM

## 2021-09-06 DIAGNOSIS — E559 Vitamin D deficiency, unspecified: Secondary | ICD-10-CM | POA: Diagnosis not present

## 2021-09-06 DIAGNOSIS — E038 Other specified hypothyroidism: Secondary | ICD-10-CM | POA: Diagnosis not present

## 2021-09-06 DIAGNOSIS — Z6841 Body Mass Index (BMI) 40.0 and over, adult: Secondary | ICD-10-CM

## 2021-09-06 MED ORDER — PHENTERMINE HCL 37.5 MG PO CAPS: ORAL_CAPSULE | ORAL | 0 refills | Status: DC

## 2021-09-06 MED ORDER — LOSARTAN POTASSIUM-HCTZ 100-25 MG PO TABS: 1.0000 | ORAL_TABLET | Freq: Every day | ORAL | 3 refills | Status: DC

## 2021-09-06 NOTE — Assessment & Plan Note (Signed)
Unfortunately has been off phentermine for a month due to issues picking up at the pharmacy.  Order shows that it is present and not picked up per PDMP and chart review.  Prior to running out had lost about 10 lbs. No SE on medication Would like to switch to [facility] when it becomes available again I feel this would be very beneficial to help with pre-dm, weight, and CV health.  Will continue phentermine at this time- 3 months sent F/U in 3 months or sooner if needed.

## 2021-09-06 NOTE — Assessment & Plan Note (Signed)
Endocrine managing.  Increase in levothyroxine in August to . No concerns today Will continue to follow

## 2021-09-06 NOTE — Assessment & Plan Note (Signed)
10lb weight loss with phentermine Working on diet and exercise Will check labs today Plan to transition to [facility] when it becomes available to blood sugar and CV benefit.  Plan to f/u in 3 months

## 2021-09-06 NOTE — Patient Instructions (Addendum)
I would like you to increase your BP medication to 2 tabs per day until you run out of your current prescription. When you pick up the new prescription, it will go back to one pill per day because I have sent the new dose over.   Your new dose is Losartan 100mg - HCTZ 25mg  combo pill.   I would like you to continue to check your blood pressures and let me know if they are staying higher than 130/80.  I will let you know when we have word that the [facility] is back in stock and we can get that sent in for you instead of the phentermine.   Please let me know if you have any issues with getting the prescription filled.

## 2021-09-06 NOTE — Assessment & Plan Note (Signed)
Monitor labs today She has been working on diet and activity In setting of HTN and pre-DM, CV risks are elevated Aggressive treatment may be needed to get this under control Hopeful that weight loss will help with overall health Will plan to switch to [facility] when it becomes available In the meantime, continue diet, exercise, and phentermine for weight management.

## 2021-09-06 NOTE — Assessment & Plan Note (Signed)
Completed weekly dosing.  Will recheck levels today to ensure that they have improved.  Will repeat weekly dosing if still low. Otherwise- daily 1000iU Vitamin D3 recommended for maintenance.

## 2021-09-06 NOTE — Assessment & Plan Note (Signed)
BP still not at goal with addition of losartan to HCTZ. Reports BP in 150's systolic routinely.  Will increase losartan-hctz to 100-25mg  dosing  Continue to monitor BP at home a few days a week.  Goal BP < 130/80. Will check labs today F/U in 3 months or sooner if needed

## 2021-09-06 NOTE — Progress Notes (Addendum)
Established Patient Office Visit  Subjective:  Patient ID: Carla Lawrence, female    DOB: 12/29/1960  Age: 60 y.o. MRN: [id number]  CC:  Chief Complaint  Patient presents with   Hypertension    Follow up for bp.    HPI Carla Lawrence presents for HTN and weight management.   At her prior visit, she was started on phentermine 37.72m per day to help with weight management and reduction in co-morbid conditions due to weight.  Today she tells me she has been out of her phentermine for a month. The pharmacy did not fill her 3rd prescription when she requested this for unknown reasons. She did loose about 10 pounds initially. She would like to continue this if she can- [facility]   Her BP has been elevated about the same as it is in the office today.  No HA, vision changes, palpitations, or chest pain.  She reports feeling well overall.   Her last labs showed TSH elevation. She was seen by endocrinology recently and they increased her levothyroxine from 1135m to 12527m  She is not having any symptoms.   She is not on medication for her lipids or pre-diabetes at this time. She has been working to help manage with diet and exercise, as well as weight loss.    Outpatient Medications Prior to Visit  Medication Sig Dispense Refill   montelukast (SINGULAIR) 10 MG tablet Take 1 tablet by mouth daily.     levothyroxine (SYNTHROID) 125 MCG tablet Take 1 tablet (125 mcg total) by mouth daily. 90 tablet 3   montelukast (SINGULAIR) 10 MG tablet Take 10 mg by mouth daily.     Multiple Vitamin (MULTIVITAMIN) tablet Take 1 tablet by mouth daily.     sertraline (ZOLOFT) 50 MG tablet TAKE 1 TABLET BY MOUTH DAILY 90 tablet 3   Vitamin D, Ergocalciferol, (DRISDOL) 1.25 MG (50000 UNIT) CAPS capsule Take 1 capsule (50,000 Units total) by mouth every 7 (seven) days. 12 capsule 4   losartan-hydrochlorothiazide (HYZAAR)  50-12.5 MG tablet Take 1 tablet by mouth daily. 90 tablet 1   phentermine 37.5 MG capsule One capsule by mouth qAM 30 capsule 0   phentermine 37.5 MG capsule One capsule by mouth qAM 30 capsule 0   phentermine 37.5 MG capsule One capsule by mouth qAM 30 capsule 0   No facility-administered medications prior to visit.    Allergies  Allergen Reactions   Sulfa Antibiotics Rash    ROS Review of Systems All review of systems negative except what is listed in the HPI    Objective:    Physical Exam Vitals and nursing note reviewed.  Constitutional:      Appearance: Normal appearance.  HENT:     Head: Normocephalic.  Eyes:     Extraocular Movements: Extraocular movements intact.     Conjunctiva/sclera: Conjunctivae normal.     Pupils: Pupils are equal, round, and reactive to light.  Neck:     Vascular: No carotid bruit.  Cardiovascular:     Rate and Rhythm: Normal rate and regular rhythm.     Pulses: Normal pulses.     Heart sounds: Normal heart sounds.  Pulmonary:     Effort: Pulmonary effort is normal.     Breath sounds: Normal breath sounds.  Musculoskeletal:     Cervical back: Normal range of motion.     Right lower leg: No edema.  Left lower leg: No edema.  Skin:    General: Skin is warm and dry.     Capillary Refill: Capillary refill takes less than 2 seconds.  Neurological:     General: No focal deficit present.     Mental Status: She is alert and oriented to person, place, and time.  Psychiatric:        Mood and Affect: Mood normal.        Behavior: Behavior normal.        Thought Content: Thought content normal.        Judgment: Judgment normal.    BP (!) 155/66   Pulse 60   Ht $R'5\' 2"'Tg$  (1.575 m)   Wt 237 lb (107.5 kg)   LMP 11/18/2005   SpO2 100%   BMI 43.35 kg/m  Wt Readings from Last 3 Encounters:  09/06/21 237 lb (107.5 kg)  07/18/21 238 lb 9.6 oz (108.2 kg)  06/08/21 243 lb 12.8 oz (110.6 kg)    There are no preventive care reminders to  display for this patient.  Lab Results  Component Value Date   TSH 18.39 (H) 07/18/2021   Lab Results  Component Value Date   WBC 5.6 03/08/2021   HGB 13.5 03/08/2021   HCT 42.0 03/08/2021   MCV 87.7 03/08/2021   PLT 227 03/08/2021   Lab Results  Component Value Date   NA 141 09/06/2021   K 4.3 09/06/2021   CO2 23 09/06/2021   GLUCOSE 100 (H) 09/06/2021   BUN 15 09/06/2021   CREATININE 0.73 09/06/2021   BILITOT 0.4 09/06/2021   ALKPHOS 63 09/06/2021   AST 13 09/06/2021   ALT 16 09/06/2021   PROT 7.0 09/06/2021   ALBUMIN 4.6 09/06/2021   CALCIUM 9.5 09/06/2021   ANIONGAP 8 03/08/2021   EGFR 94 09/06/2021   Lab Results  Component Value Date   CHOL 255 (H) 09/06/2021   Lab Results  Component Value Date   HDL 58 09/06/2021   Lab Results  Component Value Date   LDLCALC 172 (H) 09/06/2021   Lab Results  Component Value Date   TRIG 140 09/06/2021   Lab Results  Component Value Date   CHOLHDL 4.4 09/06/2021   Lab Results  Component Value Date   HGBA1C 5.9 (H) 09/06/2021      Assessment & Plan:   Problem List Items Addressed This Visit     Hypothyroidism    Endocrine managing.  Increase in levothyroxine in August to 15mcg. No concerns today Will continue to follow      Relevant Medications   losartan-hydrochlorothiazide (HYZAAR) 100-25 MG tablet   Other Relevant Orders   Comprehensive metabolic panel (Completed)   Hemoglobin A1c (Completed)   Lipid panel (Completed)   Vitamin D deficiency    Completed weekly dosing.  Will recheck levels today to ensure that they have improved.  Will repeat weekly dosing if still low. Otherwise- daily 1000iU Vitamin D3 recommended for maintenance.       Relevant Orders   VITAMIN D 25 Hydroxy (Vit-D Deficiency, Fractures) (Completed)   BMI 40.0-44.9, adult ([place])    Unfortunately has been off phentermine for a month due to issues picking up at the pharmacy.  Order shows that it is present and not picked up per  PDMP and chart review.  Prior to running out had lost about 10 lbs. No SE on medication Would like to switch to [facility] when it becomes available again I feel this would be very beneficial  to help with pre-dm, weight, and CV health.  Will continue phentermine at this time- 3 months sent F/U in 3 months or sooner if needed.       Relevant Medications   phentermine 37.5 MG capsule   phentermine 37.5 MG capsule (Start on 10/04/2021)   phentermine 37.5 MG capsule (Start on 11/01/2021)   Other Relevant Orders   Comprehensive metabolic panel (Completed)   Hemoglobin A1c (Completed)   Lipid panel (Completed)   Primary hypertension - Primary    BP still not at goal with addition of losartan to HCTZ. Reports BP in 680'S systolic routinely.  Will increase losartan-hctz to 100-27m dosing  Continue to monitor BP at home a few days a week.  Goal BP < 130/80. Will check labs today F/U in 3 months or sooner if needed      Relevant Medications   losartan-hydrochlorothiazide (HYZAAR) 100-25 MG tablet   Other Relevant Orders   Comprehensive metabolic panel (Completed)   Hemoglobin A1c (Completed)   Lipid panel (Completed)   Mixed hyperlipidemia    Monitor labs today She has been working on diet and activity In setting of HTN and pre-DM, CV risks are elevated Aggressive treatment may be needed to get this under control Hopeful that weight loss will help with overall health [facility] In the meantime, continue diet, exercise, and phentermine for weight management.       Relevant Medications   losartan-hydrochlorothiazide (HYZAAR) 100-25 MG tablet   Other Relevant Orders   Comprehensive metabolic panel (Completed)   Hemoglobin A1c (Completed)   Lipid panel (Completed)   Prediabetes    10lb weight loss with phentermine Working on diet and exercise Will check labs today [facility] blood sugar and  CV benefit.  Plan to f/u in 3 months      Relevant Medications   losartan-hydrochlorothiazide (HYZAAR) 100-25 MG tablet   Other Relevant Orders   Comprehensive metabolic panel (Completed)   Hemoglobin A1c (Completed)   Lipid panel (Completed)    Meds ordered this encounter  Medications   phentermine 37.5 MG capsule    Sig: One capsule by mouth qAM. Refill 1 of 3.    Dispense:  30 capsule    Refill:  0   phentermine 37.5 MG capsule    Sig: One capsule by mouth qAM. Refill of 2 of 3.    Dispense:  30 capsule    Refill:  0   phentermine 37.5 MG capsule    Sig: One capsule by mouth qAM. Refill 3 of 3.    Dispense:  30 capsule    Refill:  0   losartan-hydrochlorothiazide (HYZAAR) 100-25 MG tablet    Sig: Take 1 tablet by mouth daily.    Dispense:  90 tablet    Refill:  3     Follow-up: Return in about 3 months (around 12/07/2021) for BP, Weight.    SOrma Render NP

## 2021-09-07 LAB — COMPREHENSIVE METABOLIC PANEL
ALT: 16 IU/L (ref 0–32)
AST: 13 IU/L (ref 0–40)
Albumin/Globulin Ratio: 1.9 (ref 1.2–2.2)
Albumin: 4.6 g/dL (ref 3.8–4.9)
Alkaline Phosphatase: 63 IU/L (ref 44–121)
BUN/Creatinine Ratio: 21 (ref 12–28)
BUN: 15 mg/dL (ref 8–27)
Bilirubin Total: 0.4 mg/dL (ref 0.0–1.2)
CO2: 23 mmol/L (ref 20–29)
Calcium: 9.5 mg/dL (ref 8.7–10.3)
Chloride: 103 mmol/L (ref 96–106)
Creatinine, Ser: 0.73 mg/dL (ref 0.57–1.00)
Globulin, Total: 2.4 g/dL (ref 1.5–4.5)
Glucose: 100 mg/dL — ABNORMAL HIGH (ref 70–99)
Potassium: 4.3 mmol/L (ref 3.5–5.2)
Sodium: 141 mmol/L (ref 134–144)
Total Protein: 7 g/dL (ref 6.0–8.5)
eGFR: 94 mL/min/{1.73_m2} (ref >59)

## 2021-09-07 LAB — LIPID PANEL
Chol/HDL Ratio: 4.4 ratio (ref 0.0–4.4)
Cholesterol, Total: 255 mg/dL — ABNORMAL HIGH (ref 100–199)
HDL: 58 mg/dL (ref >39)
LDL Chol Calc (NIH): 172 mg/dL — ABNORMAL HIGH (ref 0–99)
Triglycerides: 140 mg/dL (ref 0–149)
VLDL Cholesterol Cal: 25 mg/dL (ref 5–40)

## 2021-09-07 LAB — HEMOGLOBIN A1C
Est. average glucose Bld gHb Est-mCnc: 123 mg/dL
Hgb A1c MFr Bld: 5.9 % — ABNORMAL HIGH (ref 4.8–5.6)

## 2021-09-07 LAB — VITAMIN D 25 HYDROXY (VIT D DEFICIENCY, FRACTURES): Vit D, 25-Hydroxy: 24.3 ng/mL — ABNORMAL LOW (ref 30.0–100.0)

## 2021-09-10 ENCOUNTER — Other Ambulatory Visit (HOSPITAL_BASED_OUTPATIENT_CLINIC_OR_DEPARTMENT_OTHER): Payer: Self-pay | Admitting: Nurse Practitioner

## 2021-11-29 ENCOUNTER — Telehealth (HOSPITAL_BASED_OUTPATIENT_CLINIC_OR_DEPARTMENT_OTHER): Payer: Self-pay | Admitting: Nurse Practitioner

## 2021-11-29 NOTE — Telephone Encounter (Signed)
Pt called stated she has been having trouble getting the 3rd month of the phentermine 37.5 MG capsule [id number] filled by the pharmacy. Pt also stated she is needing her pharmacy changed to   [facility] [address] [place], [place] [address] Please advise.

## 2021-11-29 NOTE — Telephone Encounter (Signed)
Attempted to contact patient to inquire about what specific issue she is having with medication LMTCB

## 2021-12-06 ENCOUNTER — Ambulatory Visit (HOSPITAL_BASED_OUTPATIENT_CLINIC_OR_DEPARTMENT_OTHER): Payer: BC Managed Care – PPO | Admitting: Nurse Practitioner

## 2021-12-07 ENCOUNTER — Other Ambulatory Visit (HOSPITAL_BASED_OUTPATIENT_CLINIC_OR_DEPARTMENT_OTHER): Payer: Self-pay | Admitting: Nurse Practitioner

## 2021-12-07 DIAGNOSIS — I10 Essential (primary) hypertension: Secondary | ICD-10-CM

## 2021-12-11 ENCOUNTER — Ambulatory Visit (HOSPITAL_BASED_OUTPATIENT_CLINIC_OR_DEPARTMENT_OTHER): Payer: BC Managed Care – PPO | Admitting: Nurse Practitioner

## 2021-12-11 NOTE — Telephone Encounter (Signed)
Patients dose was increased and new RX was sent in already. Refill not appropriate for lower dose

## 2022-02-06 DIAGNOSIS — H66003 Acute suppurative otitis media without spontaneous rupture of ear drum, bilateral: Secondary | ICD-10-CM | POA: Diagnosis not present

## 2022-02-06 DIAGNOSIS — H6983 Other specified disorders of Eustachian tube, bilateral: Secondary | ICD-10-CM | POA: Diagnosis not present

## 2022-02-06 DIAGNOSIS — H7292 Unspecified perforation of tympanic membrane, left ear: Secondary | ICD-10-CM | POA: Diagnosis not present

## 2022-02-19 ENCOUNTER — Other Ambulatory Visit (HOSPITAL_BASED_OUTPATIENT_CLINIC_OR_DEPARTMENT_OTHER): Payer: Self-pay | Admitting: *Deleted

## 2022-02-19 MED ORDER — SERTRALINE HCL 50 MG PO TABS: 50.0000 mg | ORAL_TABLET | Freq: Every day | ORAL | 0 refills | Status: DC

## 2022-03-11 NOTE — Progress Notes (Incomplete)
61 y.o. G2P2 Married White or Caucasian female here for annual exam.   ? ?Patient's last menstrual period was 11/18/2005.          ?Sexually active: {yes no:314532}  ?The current method of family planning is {contraception:315051}.    ? ?The pregnancy intention screening data noted above was reviewed. Potential methods of contraception were discussed. The patient elected to proceed with No data recorded.  ?Exercising: {yes no:314532}  {types:19826} ?Smoker:  {YES NO:22349} ? ?Health Maintenance: ?Pap:  02/18/17 neg ?History of abnormal Pap:  {YES NO:22349} ?MMG:  09/13/20 benign ?Colonoscopy:  *** ?BMD:   *** ?Screening Labs: *** ? ? reports that she has never smoked. She has never used smokeless tobacco. She reports that she does not use drugs. ? ?Past Medical History:  ?Diagnosis Date  ? Anxiety and depression   ? Carpal tunnel syndrome, bilateral   ? History of blood transfusion   ? during T&A  ? HSV infection   ? HSV I/II  ? Hypothyroid   ? Morbid obesity ([place]) 05/15/2021  ? Perirectal abscess 4/10  ? h/o  ? Rheumatoid arthritis ([place])   ? Dr. Trudie Reed  ? Torn meniscus   ? right knee - 09/2017  ? ? ?Past Surgical History:  ?Procedure Laterality Date  ? CARPAL TUNNEL RELEASE  12/15/09  ? and tendon clean out  ? [place]  ? INCISION AND DRAINAGE PERIRECTAL ABSCESS  4/10  ? KNEE SURGERY Right 09/2017  ? TONSILLECTOMY AND ADENOIDECTOMY    ? TOTAL ABDOMINAL HYSTERECTOMY  9/07  ? TUBAL LIGATION  1995  ? ? ?Current Outpatient Medications  ?Medication Sig Dispense Refill  ? levothyroxine (SYNTHROID) 125 MCG tablet Take 1 tablet (125 mcg total) by mouth daily. 90 tablet 3  ? losartan-hydrochlorothiazide (HYZAAR) 100-25 MG tablet Take 1 tablet by mouth daily. 90 tablet 3  ? montelukast (SINGULAIR) 10 MG tablet Take 10 mg by mouth daily.    ? montelukast (SINGULAIR) 10 MG tablet Take 1 tablet by mouth daily.    ? Multiple Vitamin (MULTIVITAMIN) tablet Take 1 tablet by mouth daily.    ? phentermine 37.5 MG  capsule One capsule by mouth qAM. Refill 1 of 3. 30 capsule 0  ? phentermine 37.5 MG capsule One capsule by mouth qAM. Refill of 2 of 3. 30 capsule 0  ? phentermine 37.5 MG capsule One capsule by mouth qAM. Refill 3 of 3. 30 capsule 0  ? sertraline (ZOLOFT) 50 MG tablet Take 1 tablet (50 mg total) by mouth daily. 90 tablet 0  ? Vitamin D, Ergocalciferol, (DRISDOL) 1.25 MG (50000 UNIT) CAPS capsule Take 1 capsule (50,000 Units total) by mouth every 7 (seven) days. 12 capsule 4  ? ?No current facility-administered medications for this visit.  ? ? ?Family History  ?Problem Relation Age of Onset  ? Diabetes Maternal Grandfather   ? Cancer Father   ?     lung with metastatsis all over  ? Rheumatic fever Mother   ? Bell's palsy Mother   ? Heart disease Mother   ? Heart attack Brother   ?     over 74 years old  ? Thyroid disease Neg Hx   ? ? ?Review of Systems ? ?Exam:   ?LMP 11/18/2005     ? ?General appearance: alert, cooperative and appears stated age ?Head: Normocephalic, without obvious abnormality, atraumatic ?Neck: no adenopathy, supple, symmetrical, trachea midline and thyroid {EXAM; THYROID:18604} ?Lungs: clear to auscultation bilaterally ?Breasts: {Exam; breast:13139::"normal  appearance, no masses or tenderness"} ?Heart: regular rate and rhythm ?Abdomen: soft, non-tender; bowel sounds normal; no masses,  no organomegaly ?Extremities: extremities normal, atraumatic, no cyanosis or edema ?Skin: Skin color, texture, turgor normal. No rashes or lesions ?Lymph nodes: Cervical, supraclavicular, and axillary nodes normal. ?No abnormal inguinal nodes palpated ?Neurologic: Grossly normal ? ? ?Pelvic: External genitalia:  no lesions ?             Urethra:  normal appearing urethra with no masses, tenderness or lesions ?             Bartholins and Skenes: normal    ?             Vagina: normal appearing vagina with normal color and no discharge, no lesions ?             Cervix: {exam; cervix:14595} ?             Pap taken:  {yes no:314532} ?Bimanual Exam:  Uterus:  {exam; uterus:12215} ?             Adnexa: {exam; adnexa:12223} ?              Rectovaginal: Confirms ?              Anus:  normal sphincter tone, no lesions ? ?Chaperone, ***, CMA, was present for exam. ? ?Assessment/Plan: ?  ?

## 2022-03-12 ENCOUNTER — Ambulatory Visit (HOSPITAL_BASED_OUTPATIENT_CLINIC_OR_DEPARTMENT_OTHER): Payer: BC Managed Care – PPO | Admitting: Obstetrics & Gynecology

## 2022-03-29 ENCOUNTER — Other Ambulatory Visit (HOSPITAL_BASED_OUTPATIENT_CLINIC_OR_DEPARTMENT_OTHER): Payer: Self-pay | Admitting: Nurse Practitioner

## 2022-03-29 DIAGNOSIS — Z1231 Encounter for screening mammogram for malignant neoplasm of breast: Secondary | ICD-10-CM

## 2022-04-03 ENCOUNTER — Ambulatory Visit: Payer: BC Managed Care – PPO | Admitting: Endocrinology

## 2022-04-25 ENCOUNTER — Other Ambulatory Visit (HOSPITAL_BASED_OUTPATIENT_CLINIC_OR_DEPARTMENT_OTHER): Payer: Self-pay | Admitting: *Deleted

## 2022-04-25 MED ORDER — VITAMIN D (ERGOCALCIFEROL) 1.25 MG (50000 UNIT) PO CAPS: 50000.0000 [IU] | ORAL_CAPSULE | ORAL | 0 refills | Status: DC

## 2022-05-03 ENCOUNTER — Ambulatory Visit (HOSPITAL_BASED_OUTPATIENT_CLINIC_OR_DEPARTMENT_OTHER)
Admission: RE | Admit: 2022-05-03 | Discharge: 2022-05-03 | Disposition: A | Discharge status: Home / Self Care | Payer: BC Managed Care – PPO | Source: Ambulatory Visit | Attending: Nurse Practitioner | Admitting: Nurse Practitioner

## 2022-05-03 DIAGNOSIS — Z1231 Encounter for screening mammogram for malignant neoplasm of breast: Secondary | ICD-10-CM | POA: Insufficient documentation

## 2022-05-09 ENCOUNTER — Ambulatory Visit (INDEPENDENT_AMBULATORY_CARE_PROVIDER_SITE_OTHER): Payer: BC Managed Care – PPO | Admitting: Obstetrics & Gynecology

## 2022-05-09 ENCOUNTER — Encounter (HOSPITAL_BASED_OUTPATIENT_CLINIC_OR_DEPARTMENT_OTHER): Payer: Self-pay | Admitting: Obstetrics & Gynecology

## 2022-05-09 VITALS — BP 143/76 | HR 72 | Ht 62.0 in | Wt 231.8 lb

## 2022-05-09 DIAGNOSIS — Z01419 Encounter for gynecological examination (general) (routine) without abnormal findings: Secondary | ICD-10-CM | POA: Diagnosis not present

## 2022-05-09 DIAGNOSIS — Z90710 Acquired absence of both cervix and uterus: Secondary | ICD-10-CM | POA: Diagnosis not present

## 2022-05-09 DIAGNOSIS — E039 Hypothyroidism, unspecified: Secondary | ICD-10-CM | POA: Diagnosis not present

## 2022-05-09 DIAGNOSIS — E559 Vitamin D deficiency, unspecified: Secondary | ICD-10-CM

## 2022-05-09 DIAGNOSIS — E2839 Other primary ovarian failure: Secondary | ICD-10-CM | POA: Diagnosis not present

## 2022-05-09 DIAGNOSIS — Z8659 Personal history of other mental and behavioral disorders: Secondary | ICD-10-CM

## 2022-05-09 DIAGNOSIS — I10 Essential (primary) hypertension: Secondary | ICD-10-CM

## 2022-05-09 DIAGNOSIS — M059 Rheumatoid arthritis with rheumatoid factor, unspecified: Secondary | ICD-10-CM

## 2022-05-09 MED ORDER — SERTRALINE HCL 50 MG PO TABS: 50.0000 mg | ORAL_TABLET | Freq: Every day | ORAL | 3 refills | Status: DC

## 2022-05-09 MED ORDER — LEVOTHYROXINE SODIUM 125 MCG PO TABS: 125.0000 ug | ORAL_TABLET | Freq: Every day | ORAL | 3 refills | Status: DC

## 2022-05-09 MED ORDER — VITAMIN D (ERGOCALCIFEROL) 1.25 MG (50000 UNIT) PO CAPS: 50000.0000 [IU] | ORAL_CAPSULE | ORAL | 3 refills | Status: DC

## 2022-05-09 NOTE — Progress Notes (Signed)
61 y.o. G2P2 Married White or Caucasian female here for annual exam.  Doing well.  Denies vaginal bleeding.  Requests thyroid testing done today.  Endocrinologist has moved from [place].    I write sertraline for her.  Doing well on current dosage.  Also, h/o vit D def and on supplements.  Doing well with this.  Needs RF.  Patient's last menstrual period was 11/18/2005.          Sexually active: Yes.    The current method of family planning is status post hysterectomy.    Exercising: No.  Smoker:  no  Health Maintenance: Pap:  02/18/2017 History of abnormal Pap:  no MMG:  05/03/2022 Negative Colonoscopy:  07/30/2021 BMD:   ordered Screening Labs: thyroid testing needed today   reports that she has never smoked. She has never used smokeless tobacco. She reports that she does not use drugs.  Past Medical History:  Diagnosis Date   Anxiety and depression    Carpal tunnel syndrome, bilateral    History of blood transfusion    during T&A   HSV infection    HSV I/II   Hypothyroid    Morbid obesity (HCC) 05/15/2021   Perirectal abscess 4/10   h/o   Rheumatoid arthritis (HCC)    Dr. Nickola Major   Torn meniscus    right knee - 09/2017    Past Surgical History:  Procedure Laterality Date   CARPAL TUNNEL RELEASE  12/15/09   and tendon clean out   CESAREAN SECTION  1989, 1995   INCISION AND DRAINAGE PERIRECTAL ABSCESS  4/10   KNEE SURGERY Right 09/2017   TONSILLECTOMY AND ADENOIDECTOMY     TOTAL ABDOMINAL HYSTERECTOMY  9/07   TUBAL LIGATION  1995    Current Outpatient Medications  Medication Sig Dispense Refill   levothyroxine (SYNTHROID) 125 MCG tablet Take 1 tablet (125 mcg total) by mouth daily. 90 tablet 3   losartan-hydrochlorothiazide (HYZAAR) 100-25 MG tablet Take 1 tablet by mouth daily. 90 tablet 3   montelukast (SINGULAIR) 10 MG tablet Take 10 mg by mouth daily.     Multiple Vitamin (MULTIVITAMIN) tablet Take 1 tablet by mouth daily.     sertraline (ZOLOFT) 50 MG tablet Take  1 tablet (50 mg total) by mouth daily. 90 tablet 0   Vitamin D, Ergocalciferol, (DRISDOL) 1.25 MG (50000 UNIT) CAPS capsule Take 1 capsule (50,000 Units total) by mouth every 7 (seven) days. 12 capsule 0   No current facility-administered medications for this visit.    Family History  Problem Relation Age of Onset   Diabetes Maternal Grandfather    Cancer Father        lung with metastatsis all over   Rheumatic fever Mother    Bell's palsy Mother    Heart disease Mother    Heart attack Brother        over 22 years old   Thyroid disease Neg Hx    ROS: Genitourinary:negative  Exam:   BP (!) 143/76 (BP Location: Right Arm, Patient Position: Sitting, Cuff Size: Large)   Pulse 72   Ht 5\' 2"  (1.575 m) Comment: Reported  Wt 231 lb 12.8 oz (105.1 kg)   LMP 11/18/2005   BMI 42.40 kg/m   Height: 5\' 2"  (157.5 cm) (Reported)  General appearance: alert, cooperative and appears stated age Head: Normocephalic, without obvious abnormality, atraumatic Neck: no adenopathy, supple, symmetrical, trachea midline and thyroid normal to inspection and palpation Lungs: clear to auscultation bilaterally Breasts: normal appearance, no  masses or tenderness Heart: regular rate and rhythm Abdomen: soft, non-tender; bowel sounds normal; no masses,  no organomegaly Extremities: extremities normal, atraumatic, no cyanosis or edema Skin: Skin color, texture, turgor normal. No rashes or lesions Lymph nodes: Cervical, supraclavicular, and axillary nodes normal. No abnormal inguinal nodes palpated Neurologic: Grossly normal   Pelvic: External genitalia:  no lesions              Urethra:  normal appearing urethra with no masses, tenderness or lesions              Bartholins and Skenes: normal                 Vagina: normal appearing vagina with normal color and no discharge, no lesions              Cervix: absent              Pap taken: No. Bimanual Exam:  Uterus:  uterus absent              Adnexa: no  mass, fullness, tenderness               Rectovaginal: Confirms               Anus:  normal sphincter tone, no lesions  Chaperone, Ina Homes, CMA, was present for exam.  Assessment/Plan: 1. Well woman exam with routine gynecological exam - last pap 02/2017.  Not needed today. - MMG 05/03/2022 - Colonoscopy 07/2021 - BMD ordered - lab work done with [facility] - vaccines reviewed/updated  2. History of hysterectomy  3. Acquired hypothyroidism - levothyroxine (SYNTHROID) 125 MCG tablet; Take 1 tablet (125 mcg total) by mouth daily.  Dispense: 90 tablet; Refill: 3 - TSH - T4, free  4. Hypoestrogenism - DG BONE DENSITY (DXA); Future  5. Rheumatoid arthritis with positive rheumatoid factor, involving unspecified site [facility]) - has seen rheumatology but not on any medication  6. Primary hypertension  7. Vitamin D deficiency - Vitamin D, Ergocalciferol, (DRISDOL) 1.25 MG (50000 UNIT) CAPS capsule; Take 1 capsule (50,000 Units total) by mouth every 7 (seven) days.  Dispense: 12 capsule; Refill: 3  8. History of depression - sertraline (ZOLOFT) 50 MG tablet; Take 1 tablet (50 mg total) by mouth daily.  Dispense: 90 tablet; Refill: 3

## 2022-05-10 LAB — TSH: TSH: 3.74 u[IU]/mL (ref 0.450–4.500)

## 2022-05-10 LAB — T4, FREE: Free T4: 0.93 ng/dL (ref 0.82–1.77)

## 2022-05-12 DIAGNOSIS — Z8659 Personal history of other mental and behavioral disorders: Secondary | ICD-10-CM | POA: Insufficient documentation

## 2022-06-11 ENCOUNTER — Encounter (HOSPITAL_BASED_OUTPATIENT_CLINIC_OR_DEPARTMENT_OTHER): Payer: Self-pay | Admitting: Nurse Practitioner

## 2022-06-12 ENCOUNTER — Other Ambulatory Visit (HOSPITAL_BASED_OUTPATIENT_CLINIC_OR_DEPARTMENT_OTHER): Payer: Self-pay | Admitting: Nurse Practitioner

## 2022-06-12 DIAGNOSIS — R7303 Prediabetes: Secondary | ICD-10-CM

## 2022-06-12 DIAGNOSIS — Z6841 Body Mass Index (BMI) 40.0 and over, adult: Secondary | ICD-10-CM

## 2022-06-12 DIAGNOSIS — E782 Mixed hyperlipidemia: Secondary | ICD-10-CM

## 2022-06-12 DIAGNOSIS — I10 Essential (primary) hypertension: Secondary | ICD-10-CM

## 2022-06-12 MED ORDER — OZEMPIC (0.25 OR 0.5 MG/DOSE) 2 MG/3ML ~~LOC~~ SOPN: PEN_INJECTOR | SUBCUTANEOUS | 2 refills | Status: DC

## 2022-06-13 ENCOUNTER — Telehealth (HOSPITAL_BASED_OUTPATIENT_CLINIC_OR_DEPARTMENT_OTHER): Payer: Self-pay

## 2022-06-13 NOTE — Telephone Encounter (Signed)
Key: [id number] PA processed via covermymeds to [employer]. Awaiting determination

## 2022-06-17 ENCOUNTER — Telehealth (HOSPITAL_BASED_OUTPATIENT_CLINIC_OR_DEPARTMENT_OTHER): Payer: Self-pay | Admitting: Nurse Practitioner

## 2022-06-17 NOTE — Telephone Encounter (Signed)
PA is being processed by ins. Waiting on response.

## 2022-06-17 NOTE — Telephone Encounter (Signed)
Received a fax on 7/31 from [facility] that PA Denied. Paperwork is in providers box in the yellow tray for CMA. Please advise.

## 2022-06-17 NOTE — Telephone Encounter (Signed)
Received a fax from covermymeds for PA from [facility] for Ozempic 0.25 or 0.5 mg/dose 2mg /15ml pen-injectors. Key: 1m

## 2022-07-23 DIAGNOSIS — H25013 Cortical age-related cataract, bilateral: Secondary | ICD-10-CM | POA: Diagnosis not present

## 2022-07-23 DIAGNOSIS — H43393 Other vitreous opacities, bilateral: Secondary | ICD-10-CM | POA: Diagnosis not present

## 2022-07-23 DIAGNOSIS — H43812 Vitreous degeneration, left eye: Secondary | ICD-10-CM | POA: Diagnosis not present

## 2022-07-23 DIAGNOSIS — H52203 Unspecified astigmatism, bilateral: Secondary | ICD-10-CM | POA: Diagnosis not present

## 2022-07-23 DIAGNOSIS — H524 Presbyopia: Secondary | ICD-10-CM | POA: Diagnosis not present

## 2022-07-23 DIAGNOSIS — H5203 Hypermetropia, bilateral: Secondary | ICD-10-CM | POA: Diagnosis not present

## 2022-07-23 DIAGNOSIS — H2513 Age-related nuclear cataract, bilateral: Secondary | ICD-10-CM | POA: Diagnosis not present

## 2022-07-24 ENCOUNTER — Ambulatory Visit: Payer: BC Managed Care – PPO | Admitting: Endocrinology

## 2022-10-12 ENCOUNTER — Other Ambulatory Visit (HOSPITAL_BASED_OUTPATIENT_CLINIC_OR_DEPARTMENT_OTHER): Payer: Self-pay | Admitting: Nurse Practitioner

## 2022-10-12 DIAGNOSIS — I10 Essential (primary) hypertension: Secondary | ICD-10-CM

## 2022-10-12 DIAGNOSIS — E782 Mixed hyperlipidemia: Secondary | ICD-10-CM

## 2022-10-12 DIAGNOSIS — R7303 Prediabetes: Secondary | ICD-10-CM

## 2022-10-12 DIAGNOSIS — E038 Other specified hypothyroidism: Secondary | ICD-10-CM

## 2023-05-26 ENCOUNTER — Other Ambulatory Visit (HOSPITAL_BASED_OUTPATIENT_CLINIC_OR_DEPARTMENT_OTHER): Payer: Self-pay | Admitting: Obstetrics & Gynecology

## 2023-05-26 DIAGNOSIS — Z1231 Encounter for screening mammogram for malignant neoplasm of breast: Secondary | ICD-10-CM

## 2023-05-29 ENCOUNTER — Ambulatory Visit (HOSPITAL_BASED_OUTPATIENT_CLINIC_OR_DEPARTMENT_OTHER)
Admission: RE | Admit: 2023-05-29 | Discharge: 2023-05-29 | Disposition: A | Discharge status: Home / Self Care | Payer: No Typology Code available for payment source | Source: Ambulatory Visit | Attending: Obstetrics & Gynecology | Admitting: Obstetrics & Gynecology

## 2023-05-29 DIAGNOSIS — Z1231 Encounter for screening mammogram for malignant neoplasm of breast: Secondary | ICD-10-CM | POA: Insufficient documentation

## 2023-05-30 ENCOUNTER — Other Ambulatory Visit (HOSPITAL_BASED_OUTPATIENT_CLINIC_OR_DEPARTMENT_OTHER): Payer: Self-pay | Admitting: *Deleted

## 2023-05-30 DIAGNOSIS — Z8659 Personal history of other mental and behavioral disorders: Secondary | ICD-10-CM

## 2023-05-30 MED ORDER — SERTRALINE HCL 50 MG PO TABS: 50.0000 mg | ORAL_TABLET | Freq: Every day | ORAL | 1 refills | Status: DC

## 2023-05-30 NOTE — Progress Notes (Signed)
Pharmacy requests refill on sertraline. Pt has appt in November. Refill sent

## 2023-06-02 ENCOUNTER — Ambulatory Visit (HOSPITAL_BASED_OUTPATIENT_CLINIC_OR_DEPARTMENT_OTHER): Payer: BC Managed Care – PPO | Admitting: Obstetrics & Gynecology

## 2023-08-08 ENCOUNTER — Other Ambulatory Visit (HOSPITAL_BASED_OUTPATIENT_CLINIC_OR_DEPARTMENT_OTHER): Payer: Self-pay

## 2023-08-08 ENCOUNTER — Telehealth (HOSPITAL_BASED_OUTPATIENT_CLINIC_OR_DEPARTMENT_OTHER): Payer: Self-pay

## 2023-08-08 DIAGNOSIS — E039 Hypothyroidism, unspecified: Secondary | ICD-10-CM

## 2023-08-08 MED ORDER — LEVOTHYROXINE SODIUM 125 MCG PO TABS: 125.0000 ug | ORAL_TABLET | Freq: Every day | ORAL | 0 refills | Status: DC

## 2023-08-08 NOTE — Telephone Encounter (Signed)
Patient called and left a message on the voicemail stating that she needed a refill on her thyroid medication and high blood pressure medication. After looking in patient's chart, synthroid was sent in for a refill as Dr. Hyacinth Meeker noted in her last note that patient's endocrinologist has moved from [place]. I have called and LMOM for patient to call our office about blood pressure medication. She will need to contact her PCP for that medication. tbw

## 2023-09-22 ENCOUNTER — Ambulatory Visit (HOSPITAL_BASED_OUTPATIENT_CLINIC_OR_DEPARTMENT_OTHER): Payer: No Typology Code available for payment source | Admitting: Obstetrics & Gynecology

## 2023-10-12 ENCOUNTER — Other Ambulatory Visit (HOSPITAL_BASED_OUTPATIENT_CLINIC_OR_DEPARTMENT_OTHER): Payer: Self-pay | Admitting: Obstetrics & Gynecology

## 2023-10-12 DIAGNOSIS — E039 Hypothyroidism, unspecified: Secondary | ICD-10-CM

## 2023-10-31 ENCOUNTER — Other Ambulatory Visit (HOSPITAL_BASED_OUTPATIENT_CLINIC_OR_DEPARTMENT_OTHER): Payer: Self-pay | Admitting: Obstetrics & Gynecology

## 2023-10-31 DIAGNOSIS — Z8659 Personal history of other mental and behavioral disorders: Secondary | ICD-10-CM

## 2023-12-17 ENCOUNTER — Other Ambulatory Visit (HOSPITAL_BASED_OUTPATIENT_CLINIC_OR_DEPARTMENT_OTHER): Payer: Self-pay | Admitting: Obstetrics & Gynecology

## 2023-12-17 ENCOUNTER — Other Ambulatory Visit (HOSPITAL_BASED_OUTPATIENT_CLINIC_OR_DEPARTMENT_OTHER): Payer: Self-pay | Admitting: Certified Nurse Midwife

## 2023-12-17 DIAGNOSIS — E039 Hypothyroidism, unspecified: Secondary | ICD-10-CM

## 2024-01-22 ENCOUNTER — Ambulatory Visit (HOSPITAL_BASED_OUTPATIENT_CLINIC_OR_DEPARTMENT_OTHER): Payer: 59 | Admitting: Obstetrics & Gynecology

## 2024-01-22 ENCOUNTER — Encounter (HOSPITAL_BASED_OUTPATIENT_CLINIC_OR_DEPARTMENT_OTHER): Payer: Self-pay | Admitting: Obstetrics & Gynecology

## 2024-01-22 VITALS — BP 146/80 | HR 74 | Ht 60.0 in | Wt 234.8 lb

## 2024-01-22 DIAGNOSIS — N393 Stress incontinence (female) (male): Secondary | ICD-10-CM | POA: Diagnosis not present

## 2024-01-22 DIAGNOSIS — Z01419 Encounter for gynecological examination (general) (routine) without abnormal findings: Secondary | ICD-10-CM | POA: Diagnosis not present

## 2024-01-22 DIAGNOSIS — Z13820 Encounter for screening for osteoporosis: Secondary | ICD-10-CM

## 2024-01-22 DIAGNOSIS — R7303 Prediabetes: Secondary | ICD-10-CM

## 2024-01-22 DIAGNOSIS — E039 Hypothyroidism, unspecified: Secondary | ICD-10-CM

## 2024-01-22 DIAGNOSIS — E559 Vitamin D deficiency, unspecified: Secondary | ICD-10-CM

## 2024-01-22 DIAGNOSIS — I10 Essential (primary) hypertension: Secondary | ICD-10-CM | POA: Diagnosis not present

## 2024-01-22 DIAGNOSIS — Z8659 Personal history of other mental and behavioral disorders: Secondary | ICD-10-CM

## 2024-01-22 DIAGNOSIS — Z90710 Acquired absence of both cervix and uterus: Secondary | ICD-10-CM

## 2024-01-22 DIAGNOSIS — E782 Mixed hyperlipidemia: Secondary | ICD-10-CM

## 2024-01-22 DIAGNOSIS — E063 Autoimmune thyroiditis: Secondary | ICD-10-CM

## 2024-01-22 MED ORDER — LOSARTAN POTASSIUM-HCTZ 100-25 MG PO TABS: 1.0000 | ORAL_TABLET | Freq: Every day | ORAL | 1 refills | Status: AC

## 2024-01-22 MED ORDER — SERTRALINE HCL 50 MG PO TABS: 50.0000 mg | ORAL_TABLET | Freq: Every day | ORAL | 3 refills | Status: AC

## 2024-01-22 MED ORDER — LEVOTHYROXINE SODIUM 125 MCG PO TABS: 125.0000 ug | ORAL_TABLET | Freq: Every day | ORAL | 3 refills | Status: DC

## 2024-01-22 MED ORDER — VITAMIN D (ERGOCALCIFEROL) 1.25 MG (50000 UNIT) PO CAPS
50000.0000 [IU] | ORAL_CAPSULE | ORAL | 3 refills | Status: AC
Start: 2024-01-22 — End: ?

## 2024-01-22 NOTE — Progress Notes (Deleted)
 ANNUAL EXAM Patient name: Carla Lawrence MRN [id number]  Date of birth: 1961-04-25 Chief Complaint:   Annual Exam  History of Present Illness:   Carla Lawrence is a 63 y.o. G2P2 {race:25618} female being seen today for a routine annual exam.  Current complaints: ***  Patient's last menstrual period was 11/18/2005.   The pregnancy intention screening data noted above was reviewed. Potential methods of contraception were discussed. The patient elected to proceed with No data recorded.   Last pap 02/18/2017. Results were: NILM w/ HRHPV not done. H/O abnormal pap: no Last mammogram: 05/29/2023. Results were: normal. Family h/o breast cancer: no Last colonoscopy: ***. Results were: {normal, abnormal, n/a:23837}. Family h/o colorectal cancer: no     05/09/2022    3:25 PM 06/08/2021    8:30 AM  Depression screen PHQ 2/9  Decreased Interest 0 0  Down, Depressed, Hopeless 0 0  PHQ - 2 Score 0 0  Altered sleeping  1  Tired, decreased energy  1  Change in appetite  0  Feeling bad or failure about yourself   0  Trouble concentrating  0  Moving slowly or fidgety/restless  0  PHQ-9 Score  2  Difficult doing work/chores  Not difficult at all        06/08/2021    8:31 AM  GAD 7 : Generalized Anxiety Score  Nervous, Anxious, on Edge 0  Control/stop worrying 0  Worry too much - different things 0  Trouble relaxing 0  Restless 0  Easily annoyed or irritable 0  Afraid - awful might happen 0  Total GAD 7 Score 0     Review of Systems:   Pertinent items are noted in HPI Denies any headaches, blurred vision, fatigue, shortness of breath, chest pain, abdominal pain, abnormal vaginal discharge/itching/odor/irritation, problems with periods, bowel movements, urination, or intercourse unless otherwise stated above. Pertinent History Reviewed:  Reviewed past medical,surgical, social and family history.  Reviewed problem list, medications and allergies. Physical Assessment:   There were no vitals filed for this visit.There is no height or weight on file to calculate BMI.        Physical Examination:   General appearance - well appearing, and in no distress  Mental status - alert, oriented to person, place, and time  Psych:  She has a normal mood and affect  Skin - warm and dry, normal color, no suspicious lesions noted  Chest - effort normal, all lung fields clear to auscultation bilaterally  Heart - normal rate and regular rhythm  Neck:  midline trachea, no thyromegaly or nodules  Breasts - breasts appear normal, no suspicious masses, no skin or nipple changes or  axillary nodes  Abdomen - soft, nontender, nondistended, no masses or organomegaly  Pelvic - VULVA: normal appearing vulva with no masses, tenderness or lesions  VAGINA: normal appearing vagina with normal color and discharge, no lesions  CERVIX: normal appearing cervix without discharge or lesions, no CMT  Thin prep pap is {Desc; done/not:10129} *** HR HPV cotesting  UTERUS: uterus is felt to be normal size, shape, consistency and nontender   ADNEXA: No adnexal masses or tenderness noted.  Rectal - normal rectal, good sphincter tone, no masses felt. Hemoccult: ***  Extremities:  No swelling or varicosities noted  Chaperone present for exam  No results found for this or any previous visit (from the past 24 hours).  Assessment & Plan:  1) Well-Woman Exam  2) ***  Labs/procedures today: ***  Mammogram: {Mammo  f/u:25212::"@ 63yo"}, or sooner if problems Colonoscopy: {TCS f/u:25213::"@ 63yo"}, or sooner if problems  No orders of the defined types were placed in this encounter.   Meds: No orders of the defined types were placed in this encounter.   Follow-up: No follow-ups on file.  Sigmund Hazel, CMA 01/22/2024 3:46 PM

## 2024-01-22 NOTE — Progress Notes (Signed)
 ANNUAL EXAM Patient name: Carla Lawrence MRN [id number]  Date of birth: [birth year] Chief Complaint:   Annual Exam  History of Present Illness:   Carla Lawrence is a 63 y.o. G2P2 Caucasian female being seen today for a routine annual exam. Doing well.  Denies vaginal bleeding.  She has started to have some issues with her bladder.  Had Covid in the fall and ended up with Covid pneumonia.  She coughed so much that she started having leaking.  Coughing has improved.  Now, when bladder fills up she starts to have urgency and then can leak a little bit.  She isn't needing a pad.    She's been off her blood pressure medication because she has not had her follow up with PCP.  Saw [facility].  Did not know she was in office near her work.    Patient's last menstrual period was 11/18/2005.   Last pap not indicated.  Last mammogram: 05/2023. Results were: normal. Family h/o breast cancer: no Last colonoscopy: declined but doing cologuard.  Last one was 07/2021.  Family h/o colorectal cancer: no     01/22/2024    3:47 PM 05/09/2022    3:25 PM 06/08/2021    8:30 AM  Depression screen PHQ 2/9  Decreased Interest 0 0 0  Down, Depressed, Hopeless 0 0 0  PHQ - 2 Score 0 0 0  Altered sleeping   1  Tired, decreased energy   1  Change in appetite   0  Feeling bad or failure about yourself    0  Trouble concentrating   0  Moving slowly or fidgety/restless   0  PHQ-9 Score   2  Difficult doing work/chores   Not difficult at all    Review of Systems:   Pertinent items are noted in HPI  Denies any headaches, blurred vision, fatigue, shortness of breath, chest pain, abdominal pain, abnormal vaginal discharge/itching/odor/irritation, problems with periods, bowel movements, urination, or intercourse unless otherwise stated above. Pertinent History Reviewed:  Reviewed past medical,surgical, social and family history.   Reviewed problem list, medications and allergies. Physical Assessment:    Vitals:   01/22/24 1546  BP: (!) 146/80  Pulse: 74  Weight: 234 lb 12.8 oz (106.5 kg)  Height: 5' (1.524 m)  Body mass index is 45.86 kg/m.        Physical Examination:   General appearance - well appearing, and in no distress  Mental status - alert, oriented to person, place, and time  Psych:  She has a normal mood and affect  Skin - warm and dry, normal color, no suspicious lesions noted  Chest - effort normal, all lung fields clear to auscultation bilaterally  Heart - normal rate and regular rhythm  Neck:  midline trachea, no thyromegaly or nodules  Breasts - breasts appear normal, no suspicious masses, no skin or nipple changes or  axillary nodes  Abdomen - soft, nontender, nondistended, no masses or organomegaly  Pelvic - VULVA: normal appearing vulva with no masses, tenderness or lesions   VAGINA: normal appearing vagina with normal color and discharge, no lesions   CERVIX: normal appearing cervix without discharge or lesions, no CMT  Thin prep pap is not indicated  UTERUS: uterus is felt to be normal size, shape, consistency and nontender   ADNEXA: No adnexal masses or tenderness noted.  Rectal - normal rectal, good sphincter tone, no masses felt.   Extremities:  No swelling or varicosities noted  Chaperone present  for exam  Assessment & Plan:  1. Well woman exam with routine gynecological exam (Primary) - Pap smear not indicated due to hx of hysterectomy and negative pap smears prior to hysterectomy - Mammogram 05/2023 - Colonoscopy declined.  Cologuard being done.  Last one was negative and done 07/2021. - Bone mineral density ordered - lab work done ordered as per below - vaccines reviewed/updated  2. History of hysterectomy  3. SUI (stress urinary incontinence, female) - Ambulatory referral to Physical Therapy  4. Primary hypertension - losartan-hydrochlorothiazide (HYZAAR) 100-25 MG tablet; Take 1 tablet by mouth daily.  Dispense: 90 tablet; Refill: 1 -  Comprehensive metabolic panel  5. Hypothyroidism due to Hashimoto's thyroiditis  6. Prediabetes - Hemoglobin A1c  7. Mixed hyperlipidemia  8. Acquired hypothyroidism - levothyroxine (SYNTHROID) 125 MCG tablet; Take 1 tablet (125 mcg total) by mouth daily before breakfast.  Dispense: 90 tablet; Refill: 3 - TSH - T4, free  9. History of depression - sertraline (ZOLOFT) 50 MG tablet; Take 1 tablet (50 mg total) by mouth daily.  Dispense: 90 tablet; Refill: 3  10. Vitamin D deficiency - Vitamin D, Ergocalciferol, (DRISDOL) 1.25 MG (50000 UNIT) CAPS capsule; Take 1 capsule (50,000 Units total) by mouth every 7 (seven) days.  Dispense: 12 capsule; Refill: 3 - VITAMIN D 25 Hydroxy (Vit-D Deficiency, Fractures)  11. Osteoporosis screening - DG BONE DENSITY (DXA); Future    Follow-up: Return in about 1 year (around 01/21/2025).  Jerene Bears, MD 01/25/2024 5:43 PM

## 2024-01-23 LAB — COMPREHENSIVE METABOLIC PANEL
ALT: 14 IU/L (ref 0–32)
AST: 17 IU/L (ref 0–40)
Albumin: 4.5 g/dL (ref 3.9–4.9)
Alkaline Phosphatase: 74 IU/L (ref 44–121)
BUN/Creatinine Ratio: 27 (ref 12–28)
BUN: 20 mg/dL (ref 8–27)
Bilirubin Total: 0.2 mg/dL (ref 0.0–1.2)
CO2: 26 mmol/L (ref 20–29)
Calcium: 9.7 mg/dL (ref 8.7–10.3)
Chloride: 101 mmol/L (ref 96–106)
Creatinine, Ser: 0.74 mg/dL (ref 0.57–1.00)
Globulin, Total: 2.4 g/dL (ref 1.5–4.5)
Glucose: 88 mg/dL (ref 70–99)
Potassium: 4.1 mmol/L (ref 3.5–5.2)
Sodium: 142 mmol/L (ref 134–144)
Total Protein: 6.9 g/dL (ref 6.0–8.5)
eGFR: 91 mL/min/{1.73_m2} (ref >59)

## 2024-01-23 LAB — HEMOGLOBIN A1C
Est. average glucose Bld gHb Est-mCnc: 117 mg/dL
Hgb A1c MFr Bld: 5.7 % — ABNORMAL HIGH (ref 4.8–5.6)

## 2024-01-23 LAB — T4, FREE: Free T4: 1.1 ng/dL (ref 0.82–1.77)

## 2024-01-23 LAB — TSH: TSH: 1.66 u[IU]/mL (ref 0.450–4.500)

## 2024-01-23 LAB — VITAMIN D 25 HYDROXY (VIT D DEFICIENCY, FRACTURES): Vit D, 25-Hydroxy: 18.7 ng/mL — ABNORMAL LOW (ref 30.0–100.0)

## 2024-01-25 ENCOUNTER — Encounter (HOSPITAL_BASED_OUTPATIENT_CLINIC_OR_DEPARTMENT_OTHER): Payer: Self-pay | Admitting: Obstetrics & Gynecology

## 2024-02-05 ENCOUNTER — Inpatient Hospital Stay (HOSPITAL_BASED_OUTPATIENT_CLINIC_OR_DEPARTMENT_OTHER): Admission: RE | Admit: 2024-02-05 | Payer: PRIVATE HEALTH INSURANCE | Source: Ambulatory Visit

## 2024-08-24 ENCOUNTER — Other Ambulatory Visit (HOSPITAL_BASED_OUTPATIENT_CLINIC_OR_DEPARTMENT_OTHER): Payer: Self-pay | Admitting: Obstetrics & Gynecology

## 2024-08-24 ENCOUNTER — Encounter (HOSPITAL_BASED_OUTPATIENT_CLINIC_OR_DEPARTMENT_OTHER): Payer: Self-pay | Admitting: Obstetrics & Gynecology

## 2024-08-24 DIAGNOSIS — Z1211 Encounter for screening for malignant neoplasm of colon: Secondary | ICD-10-CM

## 2024-09-09 LAB — COLOGUARD: COLOGUARD: NEGATIVE

## 2024-09-10 ENCOUNTER — Ambulatory Visit (HOSPITAL_BASED_OUTPATIENT_CLINIC_OR_DEPARTMENT_OTHER): Payer: Self-pay | Admitting: Obstetrics & Gynecology

## 2024-09-24 ENCOUNTER — Other Ambulatory Visit (HOSPITAL_BASED_OUTPATIENT_CLINIC_OR_DEPARTMENT_OTHER): Payer: Self-pay | Admitting: Obstetrics & Gynecology

## 2024-09-24 DIAGNOSIS — Z1231 Encounter for screening mammogram for malignant neoplasm of breast: Secondary | ICD-10-CM

## 2024-10-08 ENCOUNTER — Ambulatory Visit (HOSPITAL_BASED_OUTPATIENT_CLINIC_OR_DEPARTMENT_OTHER)
Admission: RE | Admit: 2024-10-08 | Discharge: 2024-10-08 | Disposition: A | Discharge status: Home / Self Care | Payer: PRIVATE HEALTH INSURANCE | Source: Ambulatory Visit | Attending: Obstetrics & Gynecology | Admitting: Obstetrics & Gynecology

## 2024-10-08 DIAGNOSIS — Z13820 Encounter for screening for osteoporosis: Secondary | ICD-10-CM | POA: Insufficient documentation

## 2024-10-11 ENCOUNTER — Ambulatory Visit (HOSPITAL_BASED_OUTPATIENT_CLINIC_OR_DEPARTMENT_OTHER)
Admission: RE | Admit: 2024-10-11 | Discharge: 2024-10-11 | Disposition: A | Discharge status: Home / Self Care | Payer: PRIVATE HEALTH INSURANCE | Source: Ambulatory Visit | Attending: Obstetrics & Gynecology | Admitting: Obstetrics & Gynecology

## 2024-10-11 ENCOUNTER — Encounter (HOSPITAL_BASED_OUTPATIENT_CLINIC_OR_DEPARTMENT_OTHER): Payer: Self-pay | Admitting: Radiology

## 2024-10-11 DIAGNOSIS — Z1231 Encounter for screening mammogram for malignant neoplasm of breast: Secondary | ICD-10-CM | POA: Diagnosis present

## 2024-10-12 ENCOUNTER — Ambulatory Visit (HOSPITAL_BASED_OUTPATIENT_CLINIC_OR_DEPARTMENT_OTHER): Payer: Self-pay | Admitting: Obstetrics & Gynecology

## 2024-10-22 ENCOUNTER — Other Ambulatory Visit (HOSPITAL_BASED_OUTPATIENT_CLINIC_OR_DEPARTMENT_OTHER): Payer: Self-pay

## 2024-10-22 DIAGNOSIS — Z01419 Encounter for gynecological examination (general) (routine) without abnormal findings: Secondary | ICD-10-CM

## 2024-10-28 ENCOUNTER — Telehealth (HOSPITAL_BASED_OUTPATIENT_CLINIC_OR_DEPARTMENT_OTHER): Payer: Self-pay

## 2024-10-28 NOTE — Telephone Encounter (Signed)
 Spoke with patient and advised that referral was placed. Patient going to call office and schedule appointment.   Morna LOISE Quale, RN

## 2024-10-28 NOTE — Telephone Encounter (Signed)
 Would like for a nurse to call her concerning a referral; she states that she was to be referred to a primary care Doctor and she has not heard anything.

## 2024-12-10 ENCOUNTER — Other Ambulatory Visit (HOSPITAL_BASED_OUTPATIENT_CLINIC_OR_DEPARTMENT_OTHER): Payer: Self-pay | Admitting: Certified Nurse Midwife

## 2024-12-10 DIAGNOSIS — E039 Hypothyroidism, unspecified: Secondary | ICD-10-CM
